# Patient Record
Sex: Female | Born: 1950 | Race: White | Hispanic: No | Marital: Married | Smoking: Never smoker
Health system: Southern US, Community
[De-identification: ages and names within clinical notes are randomized; demographics above are authoritative.]

## PROBLEM LIST (undated history)

## (undated) DIAGNOSIS — M545 Low back pain: Secondary | ICD-10-CM

## (undated) DIAGNOSIS — R002 Palpitations: Secondary | ICD-10-CM

## (undated) DIAGNOSIS — R5381 Other malaise: Secondary | ICD-10-CM

## (undated) DIAGNOSIS — E785 Hyperlipidemia, unspecified: Secondary | ICD-10-CM

## (undated) DIAGNOSIS — K219 Gastro-esophageal reflux disease without esophagitis: Secondary | ICD-10-CM

## (undated) DIAGNOSIS — M8580 Other specified disorders of bone density and structure, unspecified site: Secondary | ICD-10-CM

## (undated) DIAGNOSIS — E0590 Thyrotoxicosis, unspecified without thyrotoxic crisis or storm: Secondary | ICD-10-CM

## (undated) DIAGNOSIS — J4520 Mild intermittent asthma, uncomplicated: Secondary | ICD-10-CM

## (undated) DIAGNOSIS — R739 Hyperglycemia, unspecified: Secondary | ICD-10-CM

## (undated) DIAGNOSIS — T7840XA Allergy, unspecified, initial encounter: Secondary | ICD-10-CM

## (undated) DIAGNOSIS — R5383 Other fatigue: Secondary | ICD-10-CM

## (undated) DIAGNOSIS — E559 Vitamin D deficiency, unspecified: Secondary | ICD-10-CM

## (undated) DIAGNOSIS — F419 Anxiety disorder, unspecified: Secondary | ICD-10-CM

## (undated) HISTORY — DX: Mild intermittent asthma, uncomplicated: J45.20

## (undated) HISTORY — DX: Hyperlipidemia, unspecified: E78.5

## (undated) HISTORY — PX: TUBAL LIGATION: SHX77

## (undated) HISTORY — DX: Palpitations: R00.2

## (undated) HISTORY — DX: Thyrotoxicosis, unspecified without thyrotoxic crisis or storm: E05.90

## (undated) HISTORY — DX: Vitamin D deficiency, unspecified: E55.9

## (undated) HISTORY — DX: Other fatigue: R53.83

## (undated) HISTORY — PX: COLONOSCOPY: SHX174

## (undated) HISTORY — DX: Low back pain: M54.5

## (undated) HISTORY — DX: Gastro-esophageal reflux disease without esophagitis: K21.9

## (undated) HISTORY — DX: Hyperglycemia, unspecified: R73.9

## (undated) HISTORY — DX: Allergy, unspecified, initial encounter: T78.40XA

## (undated) HISTORY — PX: TOOTH EXTRACTION: SUR596

## (undated) HISTORY — DX: Anxiety disorder, unspecified: F41.9

## (undated) HISTORY — DX: Other malaise: R53.81

## (undated) HISTORY — DX: Other specified disorders of bone density and structure, unspecified site: M85.80

---

## 1998-10-23 HISTORY — PX: BACK SURGERY: SHX140

## 1998-11-02 ENCOUNTER — Ambulatory Visit (HOSPITAL_COMMUNITY): Admission: RE | Admit: 1998-11-02 | Discharge: 1998-11-02 | Payer: Self-pay | Admitting: Neurosurgery

## 1998-11-02 ENCOUNTER — Encounter: Payer: Self-pay | Admitting: Neurosurgery

## 2002-08-02 ENCOUNTER — Other Ambulatory Visit: Admission: RE | Admit: 2002-08-02 | Discharge: 2002-08-02 | Payer: Self-pay | Admitting: Obstetrics and Gynecology

## 2002-09-22 HISTORY — PX: ENDOMETRIAL ABLATION: SHX621

## 2003-08-04 ENCOUNTER — Other Ambulatory Visit: Admission: RE | Admit: 2003-08-04 | Discharge: 2003-08-04 | Payer: Self-pay | Admitting: Obstetrics and Gynecology

## 2004-08-13 ENCOUNTER — Other Ambulatory Visit: Admission: RE | Admit: 2004-08-13 | Discharge: 2004-08-13 | Payer: Self-pay | Admitting: Obstetrics and Gynecology

## 2005-08-25 ENCOUNTER — Other Ambulatory Visit: Admission: RE | Admit: 2005-08-25 | Discharge: 2005-08-25 | Payer: Self-pay | Admitting: Obstetrics and Gynecology

## 2011-06-06 ENCOUNTER — Encounter: Payer: Self-pay | Admitting: Gastroenterology

## 2011-06-17 ENCOUNTER — Encounter: Payer: Self-pay | Admitting: Gastroenterology

## 2011-06-17 ENCOUNTER — Ambulatory Visit (AMBULATORY_SURGERY_CENTER): Payer: Managed Care, Other (non HMO) | Admitting: *Deleted

## 2011-06-17 VITALS — Ht 66.0 in | Wt 177.0 lb

## 2011-06-17 DIAGNOSIS — Z1211 Encounter for screening for malignant neoplasm of colon: Secondary | ICD-10-CM

## 2011-06-17 MED ORDER — PEG-KCL-NACL-NASULF-NA ASC-C 100 G PO SOLR: ORAL | Status: DC

## 2011-07-01 ENCOUNTER — Other Ambulatory Visit: Payer: Self-pay | Admitting: Gastroenterology

## 2012-09-22 HISTORY — PX: CHOLECYSTECTOMY: SHX55

## 2015-10-01 DIAGNOSIS — K219 Gastro-esophageal reflux disease without esophagitis: Secondary | ICD-10-CM | POA: Diagnosis not present

## 2015-10-01 DIAGNOSIS — R1032 Left lower quadrant pain: Secondary | ICD-10-CM | POA: Diagnosis not present

## 2015-10-01 DIAGNOSIS — K644 Residual hemorrhoidal skin tags: Secondary | ICD-10-CM | POA: Diagnosis not present

## 2015-10-01 DIAGNOSIS — R1012 Left upper quadrant pain: Secondary | ICD-10-CM | POA: Diagnosis not present

## 2015-10-12 DIAGNOSIS — R109 Unspecified abdominal pain: Secondary | ICD-10-CM | POA: Diagnosis not present

## 2015-10-17 DIAGNOSIS — M5136 Other intervertebral disc degeneration, lumbar region: Secondary | ICD-10-CM | POA: Diagnosis not present

## 2015-10-17 DIAGNOSIS — R109 Unspecified abdominal pain: Secondary | ICD-10-CM | POA: Diagnosis not present

## 2015-10-17 DIAGNOSIS — I700 Atherosclerosis of aorta: Secondary | ICD-10-CM | POA: Diagnosis not present

## 2016-03-04 DIAGNOSIS — Z1389 Encounter for screening for other disorder: Secondary | ICD-10-CM | POA: Diagnosis not present

## 2016-03-04 DIAGNOSIS — E663 Overweight: Secondary | ICD-10-CM | POA: Diagnosis not present

## 2016-03-04 DIAGNOSIS — R109 Unspecified abdominal pain: Secondary | ICD-10-CM | POA: Diagnosis not present

## 2016-03-04 DIAGNOSIS — J4520 Mild intermittent asthma, uncomplicated: Secondary | ICD-10-CM | POA: Diagnosis not present

## 2016-03-04 DIAGNOSIS — E785 Hyperlipidemia, unspecified: Secondary | ICD-10-CM | POA: Diagnosis not present

## 2016-03-04 DIAGNOSIS — Z6829 Body mass index (BMI) 29.0-29.9, adult: Secondary | ICD-10-CM | POA: Diagnosis not present

## 2016-03-04 DIAGNOSIS — K219 Gastro-esophageal reflux disease without esophagitis: Secondary | ICD-10-CM | POA: Diagnosis not present

## 2016-03-04 DIAGNOSIS — R739 Hyperglycemia, unspecified: Secondary | ICD-10-CM | POA: Diagnosis not present

## 2016-06-26 DIAGNOSIS — Z23 Encounter for immunization: Secondary | ICD-10-CM | POA: Diagnosis not present

## 2016-07-10 DIAGNOSIS — Z01419 Encounter for gynecological examination (general) (routine) without abnormal findings: Secondary | ICD-10-CM | POA: Diagnosis not present

## 2016-07-10 DIAGNOSIS — Z6828 Body mass index (BMI) 28.0-28.9, adult: Secondary | ICD-10-CM | POA: Diagnosis not present

## 2016-07-10 DIAGNOSIS — Z124 Encounter for screening for malignant neoplasm of cervix: Secondary | ICD-10-CM | POA: Diagnosis not present

## 2016-07-10 DIAGNOSIS — Z1231 Encounter for screening mammogram for malignant neoplasm of breast: Secondary | ICD-10-CM | POA: Diagnosis not present

## 2016-07-10 DIAGNOSIS — M8588 Other specified disorders of bone density and structure, other site: Secondary | ICD-10-CM | POA: Diagnosis not present

## 2016-09-09 DIAGNOSIS — R739 Hyperglycemia, unspecified: Secondary | ICD-10-CM | POA: Diagnosis not present

## 2016-09-09 DIAGNOSIS — E785 Hyperlipidemia, unspecified: Secondary | ICD-10-CM | POA: Diagnosis not present

## 2016-09-09 DIAGNOSIS — J4520 Mild intermittent asthma, uncomplicated: Secondary | ICD-10-CM | POA: Diagnosis not present

## 2016-09-09 DIAGNOSIS — K219 Gastro-esophageal reflux disease without esophagitis: Secondary | ICD-10-CM | POA: Diagnosis not present

## 2016-09-09 DIAGNOSIS — E559 Vitamin D deficiency, unspecified: Secondary | ICD-10-CM | POA: Diagnosis not present

## 2016-09-09 DIAGNOSIS — Z79899 Other long term (current) drug therapy: Secondary | ICD-10-CM | POA: Diagnosis not present

## 2016-12-23 DIAGNOSIS — Z9181 History of falling: Secondary | ICD-10-CM | POA: Diagnosis not present

## 2016-12-23 DIAGNOSIS — J4520 Mild intermittent asthma, uncomplicated: Secondary | ICD-10-CM | POA: Diagnosis not present

## 2016-12-23 DIAGNOSIS — K219 Gastro-esophageal reflux disease without esophagitis: Secondary | ICD-10-CM | POA: Diagnosis not present

## 2017-01-21 DIAGNOSIS — R1032 Left lower quadrant pain: Secondary | ICD-10-CM | POA: Diagnosis not present

## 2017-01-21 DIAGNOSIS — K219 Gastro-esophageal reflux disease without esophagitis: Secondary | ICD-10-CM | POA: Diagnosis not present

## 2017-01-21 DIAGNOSIS — R1012 Left upper quadrant pain: Secondary | ICD-10-CM | POA: Diagnosis not present

## 2017-01-21 DIAGNOSIS — K644 Residual hemorrhoidal skin tags: Secondary | ICD-10-CM | POA: Diagnosis not present

## 2017-01-22 DIAGNOSIS — Z1211 Encounter for screening for malignant neoplasm of colon: Secondary | ICD-10-CM | POA: Diagnosis not present

## 2017-01-22 DIAGNOSIS — Z1389 Encounter for screening for other disorder: Secondary | ICD-10-CM | POA: Diagnosis not present

## 2017-01-22 DIAGNOSIS — H5203 Hypermetropia, bilateral: Secondary | ICD-10-CM | POA: Diagnosis not present

## 2017-01-22 DIAGNOSIS — Z6830 Body mass index (BMI) 30.0-30.9, adult: Secondary | ICD-10-CM | POA: Diagnosis not present

## 2017-01-22 DIAGNOSIS — Z23 Encounter for immunization: Secondary | ICD-10-CM | POA: Diagnosis not present

## 2017-01-22 DIAGNOSIS — Z0000 Encounter for general adult medical examination without abnormal findings: Secondary | ICD-10-CM | POA: Diagnosis not present

## 2017-01-22 DIAGNOSIS — Z136 Encounter for screening for cardiovascular disorders: Secondary | ICD-10-CM | POA: Diagnosis not present

## 2017-01-22 DIAGNOSIS — H4303 Vitreous prolapse, bilateral: Secondary | ICD-10-CM | POA: Diagnosis not present

## 2017-01-22 DIAGNOSIS — E785 Hyperlipidemia, unspecified: Secondary | ICD-10-CM | POA: Diagnosis not present

## 2017-01-22 DIAGNOSIS — H43393 Other vitreous opacities, bilateral: Secondary | ICD-10-CM | POA: Diagnosis not present

## 2017-01-22 DIAGNOSIS — H52223 Regular astigmatism, bilateral: Secondary | ICD-10-CM | POA: Diagnosis not present

## 2017-01-22 DIAGNOSIS — H43813 Vitreous degeneration, bilateral: Secondary | ICD-10-CM | POA: Diagnosis not present

## 2017-01-22 DIAGNOSIS — H25813 Combined forms of age-related cataract, bilateral: Secondary | ICD-10-CM | POA: Diagnosis not present

## 2017-01-27 DIAGNOSIS — Z86010 Personal history of colonic polyps: Secondary | ICD-10-CM | POA: Diagnosis not present

## 2017-01-27 DIAGNOSIS — Z79899 Other long term (current) drug therapy: Secondary | ICD-10-CM | POA: Diagnosis not present

## 2017-01-27 DIAGNOSIS — Z1211 Encounter for screening for malignant neoplasm of colon: Secondary | ICD-10-CM | POA: Diagnosis not present

## 2017-01-27 DIAGNOSIS — K644 Residual hemorrhoidal skin tags: Secondary | ICD-10-CM | POA: Diagnosis not present

## 2017-01-27 DIAGNOSIS — J45909 Unspecified asthma, uncomplicated: Secondary | ICD-10-CM | POA: Diagnosis not present

## 2017-01-27 DIAGNOSIS — Z800 Family history of malignant neoplasm of digestive organs: Secondary | ICD-10-CM | POA: Diagnosis not present

## 2017-01-27 DIAGNOSIS — Z7982 Long term (current) use of aspirin: Secondary | ICD-10-CM | POA: Diagnosis not present

## 2017-01-27 DIAGNOSIS — Z9049 Acquired absence of other specified parts of digestive tract: Secondary | ICD-10-CM | POA: Diagnosis not present

## 2017-02-16 DIAGNOSIS — N3091 Cystitis, unspecified with hematuria: Secondary | ICD-10-CM | POA: Diagnosis not present

## 2017-02-16 DIAGNOSIS — R300 Dysuria: Secondary | ICD-10-CM | POA: Diagnosis not present

## 2017-04-06 DIAGNOSIS — H109 Unspecified conjunctivitis: Secondary | ICD-10-CM | POA: Diagnosis not present

## 2017-04-09 DIAGNOSIS — H5713 Ocular pain, bilateral: Secondary | ICD-10-CM | POA: Diagnosis not present

## 2017-04-09 DIAGNOSIS — H04123 Dry eye syndrome of bilateral lacrimal glands: Secondary | ICD-10-CM | POA: Diagnosis not present

## 2017-04-16 DIAGNOSIS — H04123 Dry eye syndrome of bilateral lacrimal glands: Secondary | ICD-10-CM | POA: Diagnosis not present

## 2017-07-28 DIAGNOSIS — Z23 Encounter for immunization: Secondary | ICD-10-CM | POA: Diagnosis not present

## 2017-08-28 DIAGNOSIS — E785 Hyperlipidemia, unspecified: Secondary | ICD-10-CM | POA: Diagnosis not present

## 2017-08-28 DIAGNOSIS — K219 Gastro-esophageal reflux disease without esophagitis: Secondary | ICD-10-CM | POA: Diagnosis not present

## 2017-08-28 DIAGNOSIS — E0590 Thyrotoxicosis, unspecified without thyrotoxic crisis or storm: Secondary | ICD-10-CM | POA: Diagnosis not present

## 2017-08-28 DIAGNOSIS — J4520 Mild intermittent asthma, uncomplicated: Secondary | ICD-10-CM | POA: Diagnosis not present

## 2017-08-28 DIAGNOSIS — R739 Hyperglycemia, unspecified: Secondary | ICD-10-CM | POA: Diagnosis not present

## 2017-08-28 DIAGNOSIS — Z6828 Body mass index (BMI) 28.0-28.9, adult: Secondary | ICD-10-CM | POA: Diagnosis not present

## 2017-10-13 DIAGNOSIS — M545 Low back pain, unspecified: Secondary | ICD-10-CM

## 2017-10-13 DIAGNOSIS — J4520 Mild intermittent asthma, uncomplicated: Secondary | ICD-10-CM

## 2017-10-13 DIAGNOSIS — M8580 Other specified disorders of bone density and structure, unspecified site: Secondary | ICD-10-CM

## 2017-10-13 DIAGNOSIS — E785 Hyperlipidemia, unspecified: Secondary | ICD-10-CM

## 2017-10-13 DIAGNOSIS — R5381 Other malaise: Secondary | ICD-10-CM

## 2017-10-13 DIAGNOSIS — R5383 Other fatigue: Secondary | ICD-10-CM

## 2017-10-13 DIAGNOSIS — E559 Vitamin D deficiency, unspecified: Secondary | ICD-10-CM

## 2017-10-13 DIAGNOSIS — R739 Hyperglycemia, unspecified: Secondary | ICD-10-CM

## 2017-10-13 DIAGNOSIS — F419 Anxiety disorder, unspecified: Secondary | ICD-10-CM

## 2017-10-13 DIAGNOSIS — E0590 Thyrotoxicosis, unspecified without thyrotoxic crisis or storm: Secondary | ICD-10-CM

## 2017-10-13 HISTORY — DX: Low back pain, unspecified: M54.50

## 2017-10-13 HISTORY — DX: Hyperglycemia, unspecified: R73.9

## 2017-10-13 HISTORY — DX: Anxiety disorder, unspecified: F41.9

## 2017-10-13 HISTORY — DX: Thyrotoxicosis, unspecified without thyrotoxic crisis or storm: E05.90

## 2017-10-13 HISTORY — DX: Other specified disorders of bone density and structure, unspecified site: M85.80

## 2017-10-13 HISTORY — DX: Other malaise: R53.81

## 2017-10-13 HISTORY — DX: Mild intermittent asthma, uncomplicated: J45.20

## 2017-10-13 HISTORY — DX: Hyperlipidemia, unspecified: E78.5

## 2017-10-13 HISTORY — DX: Vitamin D deficiency, unspecified: E55.9

## 2017-10-14 ENCOUNTER — Ambulatory Visit (INDEPENDENT_AMBULATORY_CARE_PROVIDER_SITE_OTHER): Payer: Medicare Other | Admitting: Cardiology

## 2017-10-14 ENCOUNTER — Encounter: Payer: Self-pay | Admitting: Cardiology

## 2017-10-14 VITALS — BP 140/80 | HR 72 | Ht 66.0 in | Wt 178.0 lb

## 2017-10-14 DIAGNOSIS — R0602 Shortness of breath: Secondary | ICD-10-CM | POA: Insufficient documentation

## 2017-10-14 DIAGNOSIS — R079 Chest pain, unspecified: Secondary | ICD-10-CM | POA: Diagnosis not present

## 2017-10-14 HISTORY — DX: Shortness of breath: R06.02

## 2017-10-14 HISTORY — DX: Chest pain, unspecified: R07.9

## 2017-10-14 MED ORDER — METOPROLOL TARTRATE 50 MG PO TABS: 50.0000 mg | ORAL_TABLET | Freq: Once | ORAL | 0 refills | Status: DC

## 2017-10-14 NOTE — Progress Notes (Signed)
Cardiology Office Note:    Date:  10/14/2017   ID:  Sheri Love, Sheri Love Aug 15, 1951, MRN [id number]  PCP:  Nicoletta Dress, MD  Cardiologist:  Shirlee More, MD    Referring MD: Nicoletta Dress, MD    ASSESSMENT:    1. Chest pain in adult   2. SOB (shortness of breath) on exertion    PLAN:    In order of problems listed above:  1. Atypical chest pain multiple previous ischemic evaluations and after detailed discussion of benefits options and risk elects to undergo cardiac CTA to define if she has coronary artery disease and to look for evidence of flow-limiting stenosis by fractional flow reserve.  If abnormal and high risk markers we need to consider the merits of angiography and cardiac revascularization 2. See discussion above she will undergo an ischemia evaluation regarding anginal equivalent   Next appointment: 4 weeks after her cardiac CTA   Medication Adjustments/Labs and Tests Ordered: Current medicines are reviewed at length with the patient today.  Concerns regarding medicines are outlined above.  Orders Placed This Encounter  Procedures  . CT CORONARY MORPH W/CTA COR W/SCORE W/CA W/CM &/OR WO/CM  . CT CORONARY FRACTIONAL FLOW RESERVE DATA PREP  . CT CORONARY FRACTIONAL FLOW RESERVE FLUID ANALYSIS  . Basic metabolic panel  . EKG 12-Lead   Meds ordered this encounter  Medications  . metoprolol tartrate (LOPRESSOR) 50 MG tablet    Sig: Take 1 tablet (50 mg total) by mouth once for 1 dose. Take 1 tablet 1 hour prior to cardiac CTA.    Dispense:  1 tablet    Refill:  0    Chief Complaint  Patient presents with  . Shortness of Breath    History of Present Illness:    Sheri Love is a 67 y.o. female with a hx of Sob and asthma last seen more than 2 1/2 yrs ago. I do not have access to my previous records in [employer]. Compliance with diet, lifestyle and medications: Yes She has had normal stress tests in 2002, 2005, 2009 and most recently a stress echo   At 10 mets in 2014 EF 60%. She is relocated to [place] but is living part of the year in [place].  Her predominant complaint is that she has intermittent shortness of breath which seems to be unrelated to her asthma but also has episodes of nonexertional chest tightness.  She has some palpitation not severe or not sustained.  She is concerned whether her symptoms are a reflection of heart disease the chest pain is nonexertional pressure substernal at times radiates into the neck.  The episodes are typically brief. Past Medical History:  Diagnosis Date  . Allergy    cold climate and seasonal  . Anxiety 10/13/2017  . Asthma    cold climate  . Dyslipidemia 10/13/2017  . GERD (gastroesophageal reflux disease)   . Hyperglycemia 10/13/2017  . Hyperthyroidism 10/13/2017  . Lumbago 10/13/2017  . Malaise and fatigue 10/13/2017  . Mild intermittent asthma without complication 12/29/8117  . Osteopenia 10/13/2017  . Vitamin D deficiency 10/13/2017   `   Past Surgical History:  Procedure Laterality Date  . BACK SURGERY  10/1998   lower back  . CHOLECYSTECTOMY  2014  . COLONOSCOPY    . ENDOMETRIAL ABLATION  2004  . TOOTH EXTRACTION    . TUBAL LIGATION  1990's    Current Medications: Current Meds  Medication Sig  . aspirin 81 MG  tablet Take 81 mg by mouth daily.    . calcium carbonate (OS-CAL) 600 MG TABS Take 600 mg by mouth daily.    . cyclobenzaprine (FLEXERIL) 10 MG tablet Take 1 tablet by mouth as needed.  . Multiple Vitamins-Minerals (MULTIVITAMIN WITH MINERALS) tablet Take 1 tablet by mouth daily.    . Nutritional Supplements (VITAMIN D BOOSTER PO) Take by mouth daily.  . pantoprazole (PROTONIX) 20 MG tablet daily.  . peg 3350 powder (MOVIPREP) 100 G SOLR MOVI PREP take as directed  . PROAIR HFA 108 (90 BASE) MCG/ACT inhaler Take 1 puff by mouth Ad lib.     Allergies:   Erythromycin   Social History   Socioeconomic History  . Marital status: Married    Spouse name: None  .  Number of children: None  . Years of education: None  . Highest education level: None  Social Needs  . Financial resource strain: None  . Food insecurity - worry: None  . Food insecurity - inability: None  . Transportation needs - medical: None  . Transportation needs - non-medical: None  Occupational History  . None  Tobacco Use  . Smoking status: Never Smoker  . Smokeless tobacco: Never Used  Substance and Sexual Activity  . Alcohol use: Yes    Comment: rarely wine  . Drug use: No  . Sexual activity: None  Other Topics Concern  . None  Social History Narrative  . None     Family History: The patient's family history includes Atrial fibrillation in her brother; Colon cancer in her mother; Diabetes in her maternal grandmother; Heart attack in her father; Heart disease in her brother, father, and mother; Hyperlipidemia in her father and mother; Hypertension in her father and mother; Liver cancer in her mother; Pancreatic cancer in her mother. ROS:   Please see the history of present illness.    All other systems reviewed and are negative.  EKGs/Labs/Other Studies Reviewed:    The following studies were reviewed today:  EKG:  EKG ordered today.  The ekg ordered today demonstrates sinus rhythm and normal  Recent Labs: CMP and A1c normal No results found for requested labs within last 8760 hours.  Recent Lipid Panel Chol 206 HDL 61 LDL 124 TG 106 No results found for: CHOL, TRIG, HDL, CHOLHDL, VLDL, LDLCALC, LDLDIRECT  Physical Exam:    VS:  BP 140/80 (BP Location: Left Arm, Patient Position: Sitting, Cuff Size: Normal)   Pulse 72   Ht 5\' 6"  (1.676 m)   Wt 178 lb (80.7 kg)   SpO2 98%   BMI 28.73 kg/m     Wt Readings from Last 3 Encounters:  10/14/17 178 lb (80.7 kg)  06/17/11 177 lb (80.3 kg)     GEN:  Well nourished, well developed in no acute distress HEENT: Normal NECK: No JVD; No carotid bruits LYMPHATICS: No lymphadenopathy CARDIAC: RRR, no murmurs, rubs,  gallops RESPIRATORY:  Clear to auscultation without rales, wheezing or rhonchi  ABDOMEN: Soft, non-tender, non-distended MUSCULOSKELETAL:  No edema; No deformity  SKIN: Warm and dry NEUROLOGIC:  Alert and oriented x 3 PSYCHIATRIC:  Normal affect    Signed, Shirlee More, MD  10/14/2017 4:04 PM    [facility]

## 2017-10-14 NOTE — Patient Instructions (Addendum)
Medication Instructions:  Your physician recommends that you continue on your current medications as directed. Please refer to the Current Medication list given to you today.  Labwork: Your physician recommends that you return for lab work in: today. BMP  Testing/Procedures: Your physician has requested that you have cardiac CT. Cardiac computed tomography (CT) is a painless test that uses an x-ray machine to take clear, detailed pictures of your heart. For further information please visit HugeFiesta.tn. Please follow instruction sheet as given.   Please arrive at the [facility] main entrance of [facility] at xx:xx AM (30-45 minutes prior to test start time)  [facility] [address] [place], [place] [address] [phone]  Proceed to the [facility] Radiology Department (First Floor).  Please follow these instructions carefully (unless otherwise directed):  Hold all erectile dysfunction medications at least 48 hours prior to test.  On the Night Before the Test: . Drink plenty of water. . Do not consume any caffeinated/decaffeinated beverages or chocolate 12 hours prior to your test. . Do not take any antihistamines 12 hours prior to your test.  On the Day of the Test: . Drink plenty of water. Do not drink any water within one hour of the test. . Do not eat any food 4 hours prior to the test. . You may take your regular medications prior to the test. . IF NOT ON A BETA BLOCKER - Take 50 mg of lopressor (metoprolol) one hour before the test.  After the Test: . Drink plenty of water. . After receiving IV contrast, you may experience a mild flushed feeling. This is normal. . On occasion, you may experience a mild rash up to 24 hours after the test. This is not dangerous. If this occurs, you can take Benadryl 25 mg and increase your fluid intake. . If you experience trouble breathing, this can be serious. If it is severe call 911 IMMEDIATELY. If it is  mild, please call our office. . If you take any of these medications: Glipizide/Metformin, Avandament, Glucavance, please do not take 48 hours after completing test.   Follow-Up: Your physician recommends that you schedule a follow-up appointment in: 4 weeks.  Any Other Special Instructions Will Be Listed Below (If Applicable).     If you need a refill on your cardiac medications before your next appointment, please call your pharmacy.    KardiaMobile [email or web address]        FDA-cleared, clinical grade mobile EKG monitor: Jodelle Red is the most clinically-validated mobile EKG used by the world's leading cardiac care medical professionals With Basic service, know instantly if your heart rhythm is normal or if atrial fibrillation is detected, and email the last single EKG recording to yourself or your doctor Premium service, available for purchase through the Kardia app for $9.99 per month or $99 per year, includes unlimited history and storage of your EKG recordings, a monthly EKG summary report to share with your doctor, along with the ability to track your blood pressure, activity and weight Includes one KardiaMobile phone clip FREE SHIPPING: Standard delivery 1-3 business days. Orders placed by 11:00am PST will ship that afternoon. Otherwise, will ship next business day. All orders ship via [employer] from [place], [place]

## 2017-10-15 LAB — BASIC METABOLIC PANEL
BUN / CREAT RATIO: 22 (ref 12–28)
BUN: 19 mg/dL (ref 8–27)
CALCIUM: 10.2 mg/dL (ref 8.7–10.3)
CHLORIDE: 103 mmol/L (ref 96–106)
CO2: 25 mmol/L (ref 20–29)
Creatinine, Ser: 0.86 mg/dL (ref 0.57–1.00)
GFR calc Af Amer: 81 mL/min/{1.73_m2} (ref >59)
GFR calc non Af Amer: 70 mL/min/{1.73_m2} (ref >59)
GLUCOSE: 94 mg/dL (ref 65–99)
POTASSIUM: 5 mmol/L (ref 3.5–5.2)
Sodium: 142 mmol/L (ref 134–144)

## 2017-10-15 NOTE — Addendum Note (Signed)
Addended by: Warner Mccreedy E on: 10/15/2017 11:12 AM   Modules accepted: Orders

## 2017-10-26 ENCOUNTER — Ambulatory Visit (HOSPITAL_COMMUNITY)
Admission: RE | Admit: 2017-10-26 | Discharge: 2017-10-26 | Disposition: A | Discharge status: Home / Self Care | Payer: Medicare Other | Source: Ambulatory Visit | Attending: Cardiology | Admitting: Cardiology

## 2017-10-26 ENCOUNTER — Ambulatory Visit (HOSPITAL_COMMUNITY): Admission: RE | Admit: 2017-10-26 | Payer: Medicare Other | Source: Ambulatory Visit

## 2017-10-26 DIAGNOSIS — R0602 Shortness of breath: Secondary | ICD-10-CM | POA: Insufficient documentation

## 2017-10-26 DIAGNOSIS — K449 Diaphragmatic hernia without obstruction or gangrene: Secondary | ICD-10-CM | POA: Diagnosis not present

## 2017-10-26 DIAGNOSIS — R0789 Other chest pain: Secondary | ICD-10-CM | POA: Diagnosis not present

## 2017-10-26 DIAGNOSIS — R079 Chest pain, unspecified: Secondary | ICD-10-CM

## 2017-10-26 MED ORDER — NITROGLYCERIN 0.4 MG SL SUBL
0.8000 mg | SUBLINGUAL_TABLET | Freq: Once | SUBLINGUAL | Status: AC
  Administered 2017-10-26: 0.8 mg via SUBLINGUAL

## 2017-10-26 MED ORDER — METOPROLOL TARTRATE 5 MG/5ML IV SOLN
INTRAVENOUS | Status: AC
  Filled 2017-10-26: qty 5

## 2017-10-26 MED ORDER — NITROGLYCERIN 0.4 MG SL SUBL
SUBLINGUAL_TABLET | SUBLINGUAL | Status: AC
  Filled 2017-10-26: qty 2

## 2017-10-26 MED ORDER — IOPAMIDOL (ISOVUE-370) INJECTION 76%
INTRAVENOUS | Status: AC
  Administered 2017-10-26: 100 mL
  Filled 2017-10-26: qty 100

## 2017-10-26 MED ORDER — METOPROLOL TARTRATE 5 MG/5ML IV SOLN
5.0000 mg | INTRAVENOUS | Status: DC | PRN
  Administered 2017-10-26: 5 mg via INTRAVENOUS

## 2017-10-28 DIAGNOSIS — Z1231 Encounter for screening mammogram for malignant neoplasm of breast: Secondary | ICD-10-CM | POA: Diagnosis not present

## 2017-11-01 NOTE — Progress Notes (Signed)
Cardiology Office Note:    Date:  11/02/2017   ID:  Sheri Love, DOB [birth year], MRN [id number]  PCP:  Nicoletta Dress, MD  Cardiologist:  Shirlee More, MD    Referring MD: Nicoletta Dress, MD    ASSESSMENT:    1. Chest pain in adult   2. SOB (shortness of breath) on exertion    PLAN:    In order of problems listed above:  1. Improved she is quite reassured with the results of her cardiac CTA at this time I perform no further cardiac testing 2. Clinically noncardiac likely related to asthma I encouraged compliance with her bronchodilator   Next appointment: As needed   Medication Adjustments/Labs and Tests Ordered: Current medicines are reviewed at length with the patient today.  Concerns regarding medicines are outlined above.  No orders of the defined types were placed in this encounter.  No orders of the defined types were placed in this encounter.   Chief Complaint  Patient presents with  . Follow-up    after cardiac CTA    History of Present Illness:    Sheri Love is a 67 y.o. female with a hx of exertional SOB last seen 10/14/17 and advised a cardiac CTA.Marland Kitchen Compliance with diet, lifestyle and medications: Yes She is quite reassured by the findings of her cardiac CTA and I reinforced with her that I would not place her on lipid-lowering therapy with close to a 0 calcium score.  She relates she uses a bronchodilator intermittently and has variable exertional shortness of breath.  She has had no further chest pain. Past Medical History:  Diagnosis Date  . Allergy    cold climate and seasonal  . Anxiety 10/13/2017  . Asthma    cold climate  . Dyslipidemia 10/13/2017  . GERD (gastroesophageal reflux disease)   . Hyperglycemia 10/13/2017  . Hyperthyroidism 10/13/2017  . Lumbago 10/13/2017  . Malaise and fatigue 10/13/2017  . Mild intermittent asthma without complication 5/46/5681  . Osteopenia 10/13/2017  . Vitamin D deficiency 10/13/2017    Past  Surgical History:  Procedure Laterality Date  . BACK SURGERY  10/1998   lower back  . CHOLECYSTECTOMY  2014  . COLONOSCOPY    . ENDOMETRIAL ABLATION  2004  . TOOTH EXTRACTION    . TUBAL LIGATION  1990's    Current Medications: Current Meds  Medication Sig  . aspirin 81 MG tablet Take 81 mg by mouth daily.    . calcium carbonate (OS-CAL) 600 MG TABS Take 600 mg by mouth daily.    . cyclobenzaprine (FLEXERIL) 10 MG tablet Take 1 tablet by mouth as needed.  . Multiple Vitamins-Minerals (MULTIVITAMIN WITH MINERALS) tablet Take 1 tablet by mouth daily.    . Nutritional Supplements (VITAMIN D BOOSTER PO) Take by mouth daily.  . pantoprazole (PROTONIX) 20 MG tablet Take 20 mg by mouth daily.   Marland Kitchen PROAIR HFA 108 (90 BASE) MCG/ACT inhaler Take 1 puff by mouth Ad lib.     Allergies:   Erythromycin   Social History   Socioeconomic History  . Marital status: Married    Spouse name: None  . Number of children: None  . Years of education: None  . Highest education level: None  Social Needs  . Financial resource strain: None  . Food insecurity - worry: None  . Food insecurity - inability: None  . Transportation needs - medical: None  . Transportation needs - non-medical: None  Occupational History  .  None  Tobacco Use  . Smoking status: Never Smoker  . Smokeless tobacco: Never Used  Substance and Sexual Activity  . Alcohol use: Yes    Comment: rarely wine  . Drug use: No  . Sexual activity: None  Other Topics Concern  . None  Social History Narrative  . None     Family History: The patient's family history includes Atrial fibrillation in her brother; Colon cancer in her mother; Diabetes in her maternal grandmother; Heart attack in her father; Heart disease in her brother, father, and mother; Hyperlipidemia in her father and mother; Hypertension in her father and mother; Liver cancer in her mother; Pancreatic cancer in her mother. ROS:   Please see the history of present  illness.    All other systems reviewed and are negative.  EKGs/Labs/Other Studies Reviewed:    The following studies were reviewed today:  Cardiac CTA: IMPRESSION: 1. Coronary artery calcium score of 2 Agatston units, placing the patient in the 49th percentile for age and gender, suggesting intermediate risk for future cardiac events. 2.  No obstructive coronary disease noted. LM: No plaque or stenosis. LAD system: The distal LAD is small in caliber. No significant plaque or stenosis noted. Circumflex system: Large PLOM.  No plaque or stenosis in LCx system. RCA system: No significant plaque or stenosis.  Recent Labs: 10/14/2017: BUN 19; Creatinine, Ser 0.86; Potassium 5.0; Sodium 142  Recent Lipid Panel No results found for: CHOL, TRIG, HDL, CHOLHDL, VLDL, LDLCALC, LDLDIRECT  Physical Exam:    VS:  BP 124/78 (BP Location: Right Arm, Patient Position: Sitting, Cuff Size: Normal)   Pulse 66   Ht 5\' 6"  (1.676 m)   Wt 183 lb 6.4 oz (83.2 kg)   SpO2 98%   BMI 29.60 kg/m     Wt Readings from Last 3 Encounters:  11/02/17 183 lb 6.4 oz (83.2 kg)  10/14/17 178 lb (80.7 kg)  06/17/11 177 lb (80.3 kg)     GEN:  Well nourished, well developed in no acute distress HEENT: Normal NECK: No JVD; No carotid bruits LYMPHATICS: No lymphadenopathy CARDIAC: RRR, no murmurs, rubs, gallops RESPIRATORY:  Clear to auscultation without rales, wheezing or rhonchi  ABDOMEN: Soft, non-tender, non-distended MUSCULOSKELETAL:  No edema; No deformity  SKIN: Warm and dry NEUROLOGIC:  Alert and oriented x 3 PSYCHIATRIC:  Normal affect    Signed, Shirlee More, MD  11/02/2017 9:33 AM    [place]

## 2017-11-02 ENCOUNTER — Ambulatory Visit (INDEPENDENT_AMBULATORY_CARE_PROVIDER_SITE_OTHER): Payer: Medicare Other | Admitting: Cardiology

## 2017-11-02 ENCOUNTER — Encounter: Payer: Self-pay | Admitting: Cardiology

## 2017-11-02 VITALS — BP 124/78 | HR 66 | Ht 66.0 in | Wt 183.4 lb

## 2017-11-02 DIAGNOSIS — R079 Chest pain, unspecified: Secondary | ICD-10-CM

## 2017-11-02 DIAGNOSIS — R0602 Shortness of breath: Secondary | ICD-10-CM | POA: Diagnosis not present

## 2017-11-02 NOTE — Patient Instructions (Signed)
Medication Instructions:  Your physician recommends that you continue on your current medications as directed. Please refer to the Current Medication list given to you today.   Labwork: None   Testing/Procedures: None  Follow-Up: Your physician wants you to follow-up as needed.   Any Other Special Instructions Will Be Listed Below (If Applicable).     If you need a refill on your cardiac medications before your next appointment, please call your pharmacy.

## 2017-11-16 ENCOUNTER — Ambulatory Visit: Payer: Medicare Other | Admitting: Cardiology

## 2017-11-27 DIAGNOSIS — R0600 Dyspnea, unspecified: Secondary | ICD-10-CM | POA: Diagnosis not present

## 2017-11-27 DIAGNOSIS — J4520 Mild intermittent asthma, uncomplicated: Secondary | ICD-10-CM | POA: Diagnosis not present

## 2017-11-27 DIAGNOSIS — K219 Gastro-esophageal reflux disease without esophagitis: Secondary | ICD-10-CM | POA: Diagnosis not present

## 2017-11-27 DIAGNOSIS — E785 Hyperlipidemia, unspecified: Secondary | ICD-10-CM | POA: Diagnosis not present

## 2017-11-27 DIAGNOSIS — Z6830 Body mass index (BMI) 30.0-30.9, adult: Secondary | ICD-10-CM | POA: Diagnosis not present

## 2018-02-12 DIAGNOSIS — R05 Cough: Secondary | ICD-10-CM | POA: Diagnosis not present

## 2018-02-12 DIAGNOSIS — R0600 Dyspnea, unspecified: Secondary | ICD-10-CM | POA: Diagnosis not present

## 2018-02-18 DIAGNOSIS — R0600 Dyspnea, unspecified: Secondary | ICD-10-CM | POA: Diagnosis not present

## 2018-03-03 DIAGNOSIS — E7800 Pure hypercholesterolemia, unspecified: Secondary | ICD-10-CM | POA: Diagnosis not present

## 2018-03-03 DIAGNOSIS — R7309 Other abnormal glucose: Secondary | ICD-10-CM | POA: Diagnosis not present

## 2018-03-03 DIAGNOSIS — M810 Age-related osteoporosis without current pathological fracture: Secondary | ICD-10-CM | POA: Diagnosis not present

## 2018-03-03 DIAGNOSIS — K219 Gastro-esophageal reflux disease without esophagitis: Secondary | ICD-10-CM | POA: Diagnosis not present

## 2018-03-18 DIAGNOSIS — R739 Hyperglycemia, unspecified: Secondary | ICD-10-CM | POA: Diagnosis not present

## 2018-03-18 DIAGNOSIS — R7309 Other abnormal glucose: Secondary | ICD-10-CM | POA: Diagnosis not present

## 2018-03-18 DIAGNOSIS — E7800 Pure hypercholesterolemia, unspecified: Secondary | ICD-10-CM | POA: Diagnosis not present

## 2018-03-18 DIAGNOSIS — K219 Gastro-esophageal reflux disease without esophagitis: Secondary | ICD-10-CM | POA: Diagnosis not present

## 2018-03-18 DIAGNOSIS — M810 Age-related osteoporosis without current pathological fracture: Secondary | ICD-10-CM | POA: Diagnosis not present

## 2018-03-29 DIAGNOSIS — E785 Hyperlipidemia, unspecified: Secondary | ICD-10-CM | POA: Diagnosis not present

## 2018-03-29 DIAGNOSIS — E0590 Thyrotoxicosis, unspecified without thyrotoxic crisis or storm: Secondary | ICD-10-CM | POA: Diagnosis not present

## 2018-03-29 DIAGNOSIS — Z6830 Body mass index (BMI) 30.0-30.9, adult: Secondary | ICD-10-CM | POA: Diagnosis not present

## 2018-03-29 DIAGNOSIS — Z0000 Encounter for general adult medical examination without abnormal findings: Secondary | ICD-10-CM | POA: Diagnosis not present

## 2018-04-05 DIAGNOSIS — E041 Nontoxic single thyroid nodule: Secondary | ICD-10-CM | POA: Diagnosis not present

## 2018-04-05 DIAGNOSIS — E0590 Thyrotoxicosis, unspecified without thyrotoxic crisis or storm: Secondary | ICD-10-CM | POA: Diagnosis not present

## 2018-04-15 DIAGNOSIS — R7989 Other specified abnormal findings of blood chemistry: Secondary | ICD-10-CM | POA: Diagnosis not present

## 2018-04-16 DIAGNOSIS — E041 Nontoxic single thyroid nodule: Secondary | ICD-10-CM | POA: Diagnosis not present

## 2018-04-16 DIAGNOSIS — E0590 Thyrotoxicosis, unspecified without thyrotoxic crisis or storm: Secondary | ICD-10-CM | POA: Diagnosis not present

## 2018-06-04 DIAGNOSIS — E0510 Thyrotoxicosis with toxic single thyroid nodule without thyrotoxic crisis or storm: Secondary | ICD-10-CM | POA: Diagnosis not present

## 2018-06-19 DIAGNOSIS — R300 Dysuria: Secondary | ICD-10-CM | POA: Diagnosis not present

## 2018-06-19 DIAGNOSIS — B9689 Other specified bacterial agents as the cause of diseases classified elsewhere: Secondary | ICD-10-CM | POA: Diagnosis not present

## 2018-06-19 DIAGNOSIS — N390 Urinary tract infection, site not specified: Secondary | ICD-10-CM | POA: Diagnosis not present

## 2018-06-19 DIAGNOSIS — I10 Essential (primary) hypertension: Secondary | ICD-10-CM | POA: Diagnosis not present

## 2018-06-29 DIAGNOSIS — R05 Cough: Secondary | ICD-10-CM | POA: Diagnosis not present

## 2018-07-20 DIAGNOSIS — E0510 Thyrotoxicosis with toxic single thyroid nodule without thyrotoxic crisis or storm: Secondary | ICD-10-CM | POA: Diagnosis not present

## 2018-07-27 DIAGNOSIS — R05 Cough: Secondary | ICD-10-CM | POA: Diagnosis not present

## 2018-07-27 DIAGNOSIS — R0600 Dyspnea, unspecified: Secondary | ICD-10-CM | POA: Diagnosis not present

## 2018-08-03 DIAGNOSIS — Z23 Encounter for immunization: Secondary | ICD-10-CM | POA: Diagnosis not present

## 2018-09-20 DIAGNOSIS — M545 Low back pain: Secondary | ICD-10-CM | POA: Diagnosis not present

## 2018-09-21 DIAGNOSIS — M549 Dorsalgia, unspecified: Secondary | ICD-10-CM | POA: Diagnosis not present

## 2018-10-11 DIAGNOSIS — E0590 Thyrotoxicosis, unspecified without thyrotoxic crisis or storm: Secondary | ICD-10-CM | POA: Diagnosis not present

## 2018-12-10 DIAGNOSIS — E0510 Thyrotoxicosis with toxic single thyroid nodule without thyrotoxic crisis or storm: Secondary | ICD-10-CM | POA: Diagnosis not present

## 2019-01-11 DIAGNOSIS — E0510 Thyrotoxicosis with toxic single thyroid nodule without thyrotoxic crisis or storm: Secondary | ICD-10-CM | POA: Diagnosis not present

## 2019-03-24 DIAGNOSIS — E0590 Thyrotoxicosis, unspecified without thyrotoxic crisis or storm: Secondary | ICD-10-CM | POA: Diagnosis not present

## 2019-03-24 DIAGNOSIS — E785 Hyperlipidemia, unspecified: Secondary | ICD-10-CM | POA: Diagnosis not present

## 2019-04-04 DIAGNOSIS — Z0000 Encounter for general adult medical examination without abnormal findings: Secondary | ICD-10-CM | POA: Diagnosis not present

## 2019-04-04 DIAGNOSIS — E0590 Thyrotoxicosis, unspecified without thyrotoxic crisis or storm: Secondary | ICD-10-CM | POA: Diagnosis not present

## 2019-04-04 DIAGNOSIS — Z01419 Encounter for gynecological examination (general) (routine) without abnormal findings: Secondary | ICD-10-CM | POA: Diagnosis not present

## 2019-04-04 DIAGNOSIS — Z124 Encounter for screening for malignant neoplasm of cervix: Secondary | ICD-10-CM | POA: Diagnosis not present

## 2019-04-22 ENCOUNTER — Other Ambulatory Visit: Payer: Self-pay

## 2019-06-30 DIAGNOSIS — E0510 Thyrotoxicosis with toxic single thyroid nodule without thyrotoxic crisis or storm: Secondary | ICD-10-CM | POA: Diagnosis not present

## 2019-07-18 DIAGNOSIS — Z780 Asymptomatic menopausal state: Secondary | ICD-10-CM | POA: Diagnosis not present

## 2019-07-18 DIAGNOSIS — Z1231 Encounter for screening mammogram for malignant neoplasm of breast: Secondary | ICD-10-CM | POA: Diagnosis not present

## 2019-07-18 DIAGNOSIS — E0510 Thyrotoxicosis with toxic single thyroid nodule without thyrotoxic crisis or storm: Secondary | ICD-10-CM | POA: Diagnosis not present

## 2019-07-18 DIAGNOSIS — E042 Nontoxic multinodular goiter: Secondary | ICD-10-CM | POA: Diagnosis not present

## 2019-08-09 DIAGNOSIS — Z23 Encounter for immunization: Secondary | ICD-10-CM | POA: Diagnosis not present

## 2019-08-17 DIAGNOSIS — B349 Viral infection, unspecified: Secondary | ICD-10-CM | POA: Diagnosis not present

## 2019-08-17 DIAGNOSIS — Z20828 Contact with and (suspected) exposure to other viral communicable diseases: Secondary | ICD-10-CM | POA: Diagnosis not present

## 2019-08-19 DIAGNOSIS — B349 Viral infection, unspecified: Secondary | ICD-10-CM | POA: Diagnosis not present

## 2019-10-15 IMAGING — CT CT HEART MORP W/ CTA COR W/ SCORE W/ CA W/CM &/OR W/O CM
4 of 7 series · 8 of 20 positions shown, 9 images · non-contrast
Comparison: None.

CLINICAL DATA: Chest pain

EXAM:
Cardiac CTA
MEDICATIONS:
Sub lingual nitro. 4mg x 2 and lopressor 5mg IV x 1
TECHNIQUE: The patient was scanned on a Siemens [REDACTED]ice scanner. Gantry
rotation speed was 240 msecs. Collimation was 0.6 mm. A 100 kV
prospective scan was triggered in the ascending thoracic aorta at
111 HU's with 35-75% of the R-R interval. Average HR during the scan
was 60 bpm. The 3D data set was interpreted on a dedicated work
station using MPR, MIP and VRT modes. A total of 80cc of contrast
was used.

[Series 6: best diast 74 % · axial · 0.30mm/px · z∈[-454,-410]mm · 2 of 331 slices shown, 3 images]
[im 111/331  vessel]
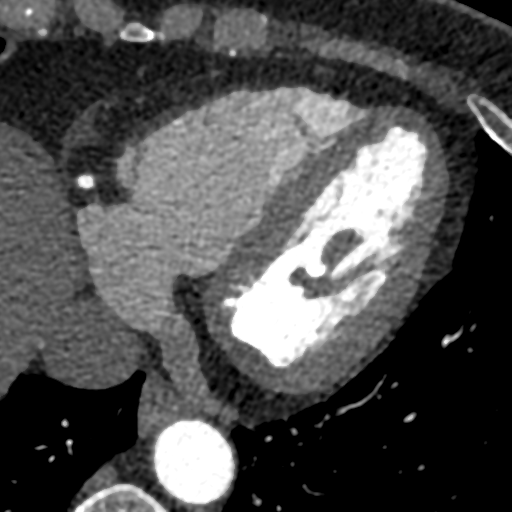
[im 111/331  lung]
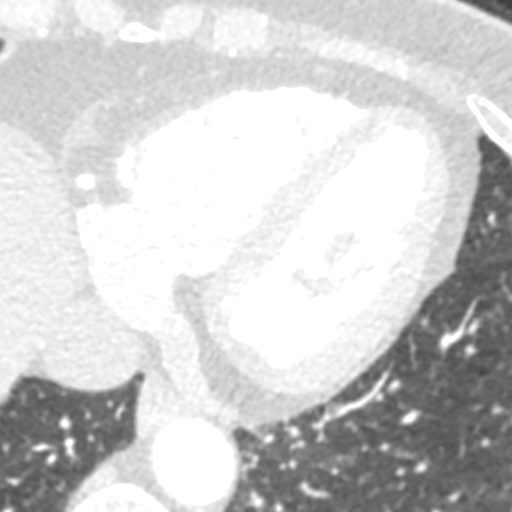
[im 221/331  vessel]
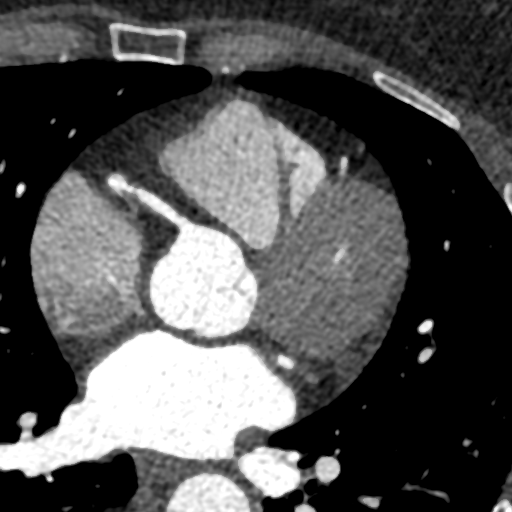

[Series 7: best syst 42 % · axial · 0.30mm/px · z∈[-454,-410]mm · 2 of 331 slices shown]
[im 111/331  vessel]
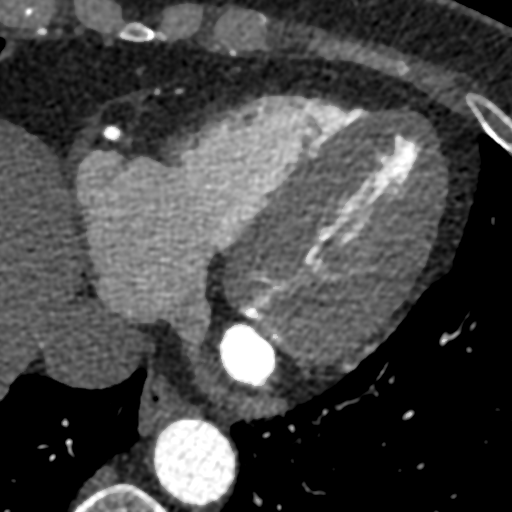
[im 221/331  vessel]
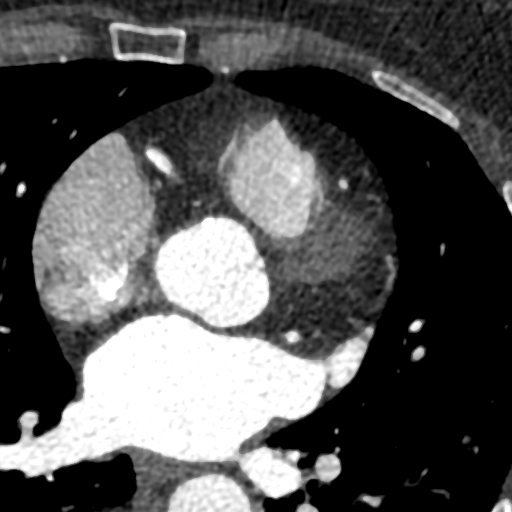

[Series 8: ts diast sharp 74 % · axial · 0.30mm/px · z∈[-454,-410]mm · 2 of 331 slices shown]
[im 111/331  lung]
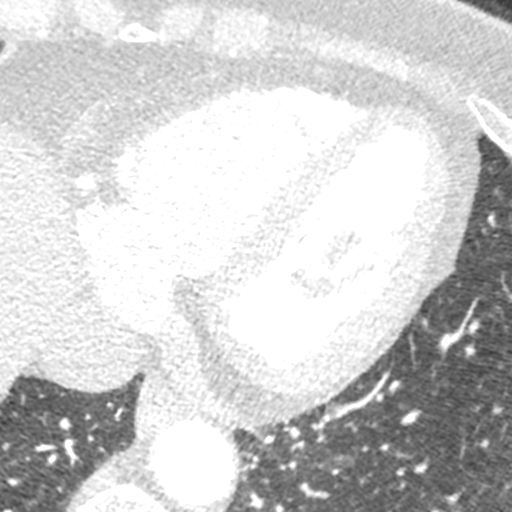
[im 221/331  lung]
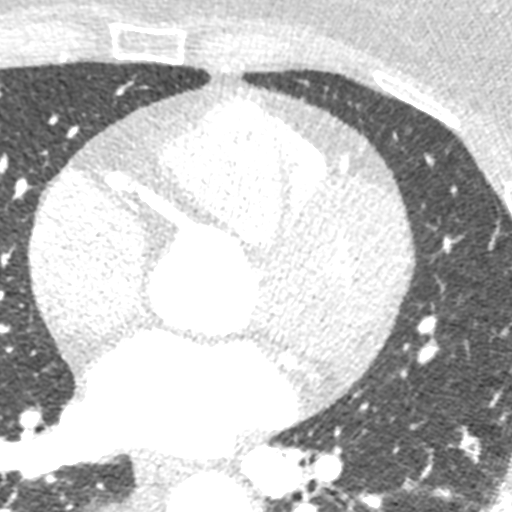

[Series 9: ts syst sharp 42 % · axial · 0.30mm/px · z∈[-454,-410]mm · 2 of 331 slices shown]
[im 111/331  lung]
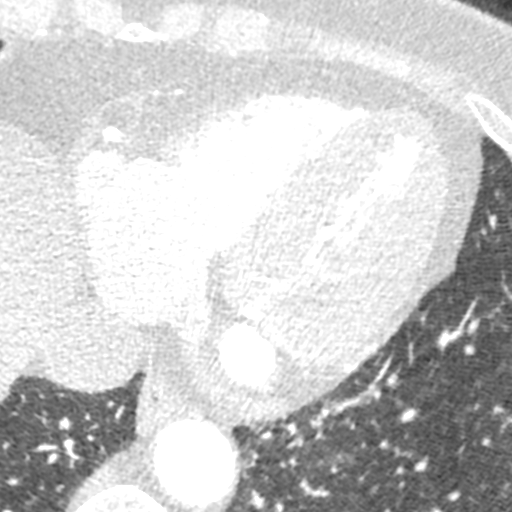
[im 221/331  lung]
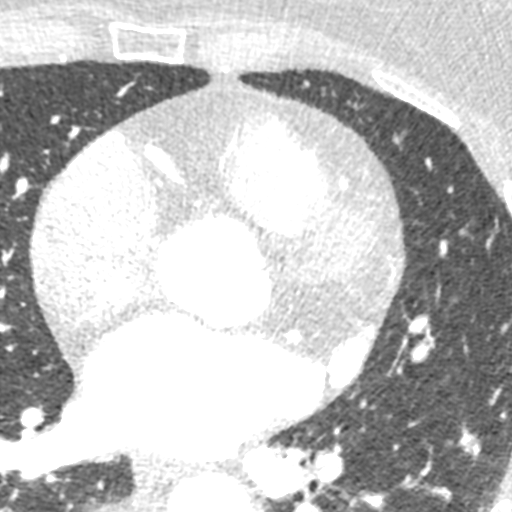

[8 of 20 positions shown; findings below may reference images not displayed]

FINDINGS: Non-cardiac: See separate report from [REDACTED].

Calcium Score: 2 Agatston units.

Coronary Arteries: Right dominant with no anomalies

LM: No plaque or stenosis.

LAD system: The distal LAD is small in caliber. No significant
plaque or stenosis noted.

Circumflex system: Large PLOM.  No plaque or stenosis in LCx system.

RCA system: No significant plaque or stenosis.
IMPRESSION: 1. Coronary artery calcium score of 2 Agatston units, placing the
patient in the 49th percentile for age and gender, suggesting
intermediate risk for future cardiac events.

2.  No obstructive coronary disease noted.

Abu Mina Khana

EXAM:
OVER-READ INTERPRETATION  CT CHEST

The following report is an over-read performed by radiologist Dr.
Devny Tuibua [REDACTED] on 10/26/2017. This over-read
does not include interpretation of cardiac or coronary anatomy or
pathology. The coronary CTA interpretation by the cardiologist is
attached.
FINDINGS: Vascular: Heart is normal size.  Visualized aorta normal caliber.

Mediastinum/Nodes: No adenopathy in the lower mediastinum or hila.
Small hiatal hernia.

Lungs/Pleura: Visualized lungs clear.  No effusions.

Upper Abdomen: Imaging into the upper abdomen shows no acute
findings.

Musculoskeletal: Chest wall soft tissues are unremarkable. No acute
bony abnormality.
IMPRESSION: No acute extracardiac abnormality.

Small hiatal hernia.

## 2019-12-09 DIAGNOSIS — Z23 Encounter for immunization: Secondary | ICD-10-CM | POA: Diagnosis not present

## 2019-12-28 DIAGNOSIS — R1012 Left upper quadrant pain: Secondary | ICD-10-CM | POA: Diagnosis not present

## 2019-12-28 DIAGNOSIS — E0510 Thyrotoxicosis with toxic single thyroid nodule without thyrotoxic crisis or storm: Secondary | ICD-10-CM | POA: Diagnosis not present

## 2019-12-29 DIAGNOSIS — D229 Melanocytic nevi, unspecified: Secondary | ICD-10-CM | POA: Diagnosis not present

## 2019-12-29 DIAGNOSIS — R030 Elevated blood-pressure reading, without diagnosis of hypertension: Secondary | ICD-10-CM | POA: Diagnosis not present

## 2019-12-29 DIAGNOSIS — R1012 Left upper quadrant pain: Secondary | ICD-10-CM | POA: Diagnosis not present

## 2019-12-29 DIAGNOSIS — R3911 Hesitancy of micturition: Secondary | ICD-10-CM | POA: Diagnosis not present

## 2019-12-29 DIAGNOSIS — K589 Irritable bowel syndrome without diarrhea: Secondary | ICD-10-CM | POA: Diagnosis not present

## 2020-01-05 DIAGNOSIS — E0510 Thyrotoxicosis with toxic single thyroid nodule without thyrotoxic crisis or storm: Secondary | ICD-10-CM | POA: Diagnosis not present

## 2020-01-09 DIAGNOSIS — R1012 Left upper quadrant pain: Secondary | ICD-10-CM | POA: Diagnosis not present

## 2020-01-10 DIAGNOSIS — Z23 Encounter for immunization: Secondary | ICD-10-CM | POA: Diagnosis not present

## 2020-01-13 DIAGNOSIS — R1012 Left upper quadrant pain: Secondary | ICD-10-CM | POA: Diagnosis not present

## 2020-01-13 DIAGNOSIS — K589 Irritable bowel syndrome without diarrhea: Secondary | ICD-10-CM | POA: Diagnosis not present

## 2020-02-28 DIAGNOSIS — Z808 Family history of malignant neoplasm of other organs or systems: Secondary | ICD-10-CM | POA: Diagnosis not present

## 2020-02-28 DIAGNOSIS — D485 Neoplasm of uncertain behavior of skin: Secondary | ICD-10-CM | POA: Diagnosis not present

## 2020-02-28 DIAGNOSIS — C44519 Basal cell carcinoma of skin of other part of trunk: Secondary | ICD-10-CM | POA: Diagnosis not present

## 2020-02-28 DIAGNOSIS — D229 Melanocytic nevi, unspecified: Secondary | ICD-10-CM | POA: Diagnosis not present

## 2020-02-28 DIAGNOSIS — L821 Other seborrheic keratosis: Secondary | ICD-10-CM | POA: Diagnosis not present

## 2020-02-28 DIAGNOSIS — D235 Other benign neoplasm of skin of trunk: Secondary | ICD-10-CM | POA: Diagnosis not present

## 2020-02-28 DIAGNOSIS — D225 Melanocytic nevi of trunk: Secondary | ICD-10-CM | POA: Diagnosis not present

## 2020-04-09 DIAGNOSIS — L821 Other seborrheic keratosis: Secondary | ICD-10-CM | POA: Diagnosis not present

## 2020-04-09 DIAGNOSIS — D225 Melanocytic nevi of trunk: Secondary | ICD-10-CM | POA: Diagnosis not present

## 2020-04-09 DIAGNOSIS — L578 Other skin changes due to chronic exposure to nonionizing radiation: Secondary | ICD-10-CM | POA: Diagnosis not present

## 2020-04-12 DIAGNOSIS — E559 Vitamin D deficiency, unspecified: Secondary | ICD-10-CM | POA: Diagnosis not present

## 2020-04-12 DIAGNOSIS — K580 Irritable bowel syndrome with diarrhea: Secondary | ICD-10-CM | POA: Diagnosis not present

## 2020-04-12 DIAGNOSIS — K219 Gastro-esophageal reflux disease without esophagitis: Secondary | ICD-10-CM | POA: Diagnosis not present

## 2020-04-12 DIAGNOSIS — Z6828 Body mass index (BMI) 28.0-28.9, adult: Secondary | ICD-10-CM | POA: Diagnosis not present

## 2020-04-12 DIAGNOSIS — E0590 Thyrotoxicosis, unspecified without thyrotoxic crisis or storm: Secondary | ICD-10-CM | POA: Diagnosis not present

## 2020-05-14 DIAGNOSIS — Z1231 Encounter for screening mammogram for malignant neoplasm of breast: Secondary | ICD-10-CM | POA: Diagnosis not present

## 2020-05-14 DIAGNOSIS — Z79899 Other long term (current) drug therapy: Secondary | ICD-10-CM | POA: Diagnosis not present

## 2020-05-14 DIAGNOSIS — K219 Gastro-esophageal reflux disease without esophagitis: Secondary | ICD-10-CM | POA: Diagnosis not present

## 2020-05-14 DIAGNOSIS — R739 Hyperglycemia, unspecified: Secondary | ICD-10-CM | POA: Diagnosis not present

## 2020-05-14 DIAGNOSIS — Z6828 Body mass index (BMI) 28.0-28.9, adult: Secondary | ICD-10-CM | POA: Diagnosis not present

## 2020-05-14 DIAGNOSIS — E0590 Thyrotoxicosis, unspecified without thyrotoxic crisis or storm: Secondary | ICD-10-CM | POA: Diagnosis not present

## 2020-05-14 DIAGNOSIS — E559 Vitamin D deficiency, unspecified: Secondary | ICD-10-CM | POA: Diagnosis not present

## 2020-05-14 DIAGNOSIS — E785 Hyperlipidemia, unspecified: Secondary | ICD-10-CM | POA: Diagnosis not present

## 2020-05-14 DIAGNOSIS — K580 Irritable bowel syndrome with diarrhea: Secondary | ICD-10-CM | POA: Diagnosis not present

## 2020-05-17 DIAGNOSIS — R739 Hyperglycemia, unspecified: Secondary | ICD-10-CM | POA: Diagnosis not present

## 2020-05-17 DIAGNOSIS — E785 Hyperlipidemia, unspecified: Secondary | ICD-10-CM | POA: Diagnosis not present

## 2020-05-17 DIAGNOSIS — E0590 Thyrotoxicosis, unspecified without thyrotoxic crisis or storm: Secondary | ICD-10-CM | POA: Diagnosis not present

## 2020-05-17 DIAGNOSIS — E041 Nontoxic single thyroid nodule: Secondary | ICD-10-CM | POA: Diagnosis not present

## 2020-05-21 DIAGNOSIS — Z79899 Other long term (current) drug therapy: Secondary | ICD-10-CM | POA: Diagnosis not present

## 2020-05-21 DIAGNOSIS — E0590 Thyrotoxicosis, unspecified without thyrotoxic crisis or storm: Secondary | ICD-10-CM | POA: Diagnosis not present

## 2020-06-14 DIAGNOSIS — E041 Nontoxic single thyroid nodule: Secondary | ICD-10-CM | POA: Diagnosis not present

## 2020-07-16 DIAGNOSIS — D2239 Melanocytic nevi of other parts of face: Secondary | ICD-10-CM | POA: Diagnosis not present

## 2020-07-16 DIAGNOSIS — C44519 Basal cell carcinoma of skin of other part of trunk: Secondary | ICD-10-CM | POA: Diagnosis not present

## 2020-07-16 DIAGNOSIS — L309 Dermatitis, unspecified: Secondary | ICD-10-CM | POA: Diagnosis not present

## 2020-07-18 DIAGNOSIS — E0590 Thyrotoxicosis, unspecified without thyrotoxic crisis or storm: Secondary | ICD-10-CM | POA: Diagnosis not present

## 2020-07-18 DIAGNOSIS — E559 Vitamin D deficiency, unspecified: Secondary | ICD-10-CM | POA: Diagnosis not present

## 2020-07-18 DIAGNOSIS — Z1231 Encounter for screening mammogram for malignant neoplasm of breast: Secondary | ICD-10-CM | POA: Diagnosis not present

## 2020-07-23 DIAGNOSIS — Z23 Encounter for immunization: Secondary | ICD-10-CM | POA: Diagnosis not present

## 2020-07-25 DIAGNOSIS — E785 Hyperlipidemia, unspecified: Secondary | ICD-10-CM | POA: Diagnosis not present

## 2020-07-25 DIAGNOSIS — E041 Nontoxic single thyroid nodule: Secondary | ICD-10-CM | POA: Diagnosis not present

## 2020-07-25 DIAGNOSIS — R739 Hyperglycemia, unspecified: Secondary | ICD-10-CM | POA: Diagnosis not present

## 2020-07-25 DIAGNOSIS — E0590 Thyrotoxicosis, unspecified without thyrotoxic crisis or storm: Secondary | ICD-10-CM | POA: Diagnosis not present

## 2020-09-06 DIAGNOSIS — I889 Nonspecific lymphadenitis, unspecified: Secondary | ICD-10-CM | POA: Diagnosis not present

## 2020-09-06 DIAGNOSIS — M549 Dorsalgia, unspecified: Secondary | ICD-10-CM | POA: Diagnosis not present

## 2020-09-06 DIAGNOSIS — Z6829 Body mass index (BMI) 29.0-29.9, adult: Secondary | ICD-10-CM | POA: Diagnosis not present

## 2020-09-06 DIAGNOSIS — E785 Hyperlipidemia, unspecified: Secondary | ICD-10-CM | POA: Diagnosis not present

## 2020-10-30 DIAGNOSIS — R5383 Other fatigue: Secondary | ICD-10-CM | POA: Diagnosis not present

## 2020-10-30 DIAGNOSIS — R059 Cough, unspecified: Secondary | ICD-10-CM | POA: Diagnosis not present

## 2020-10-30 DIAGNOSIS — R002 Palpitations: Secondary | ICD-10-CM | POA: Diagnosis not present

## 2020-10-30 DIAGNOSIS — Z6830 Body mass index (BMI) 30.0-30.9, adult: Secondary | ICD-10-CM | POA: Diagnosis not present

## 2020-10-30 DIAGNOSIS — R5381 Other malaise: Secondary | ICD-10-CM | POA: Diagnosis not present

## 2020-10-30 DIAGNOSIS — E0590 Thyrotoxicosis, unspecified without thyrotoxic crisis or storm: Secondary | ICD-10-CM | POA: Diagnosis not present

## 2020-10-30 DIAGNOSIS — J4520 Mild intermittent asthma, uncomplicated: Secondary | ICD-10-CM | POA: Diagnosis not present

## 2020-11-05 ENCOUNTER — Encounter: Payer: Self-pay | Admitting: Cardiology

## 2020-11-05 ENCOUNTER — Encounter: Payer: Self-pay | Admitting: *Deleted

## 2020-11-13 DIAGNOSIS — E0590 Thyrotoxicosis, unspecified without thyrotoxic crisis or storm: Secondary | ICD-10-CM | POA: Diagnosis not present

## 2020-11-13 DIAGNOSIS — E559 Vitamin D deficiency, unspecified: Secondary | ICD-10-CM | POA: Diagnosis not present

## 2020-11-22 DIAGNOSIS — M8580 Other specified disorders of bone density and structure, unspecified site: Secondary | ICD-10-CM | POA: Diagnosis not present

## 2020-11-22 DIAGNOSIS — E785 Hyperlipidemia, unspecified: Secondary | ICD-10-CM | POA: Diagnosis not present

## 2020-11-22 DIAGNOSIS — R739 Hyperglycemia, unspecified: Secondary | ICD-10-CM | POA: Diagnosis not present

## 2020-11-22 DIAGNOSIS — E559 Vitamin D deficiency, unspecified: Secondary | ICD-10-CM | POA: Diagnosis not present

## 2020-11-22 DIAGNOSIS — E041 Nontoxic single thyroid nodule: Secondary | ICD-10-CM | POA: Diagnosis not present

## 2020-11-22 DIAGNOSIS — E0590 Thyrotoxicosis, unspecified without thyrotoxic crisis or storm: Secondary | ICD-10-CM | POA: Diagnosis not present

## 2020-11-26 DIAGNOSIS — R002 Palpitations: Secondary | ICD-10-CM | POA: Insufficient documentation

## 2020-11-26 DIAGNOSIS — T7840XA Allergy, unspecified, initial encounter: Secondary | ICD-10-CM | POA: Insufficient documentation

## 2020-11-26 DIAGNOSIS — K219 Gastro-esophageal reflux disease without esophagitis: Secondary | ICD-10-CM | POA: Insufficient documentation

## 2020-12-03 NOTE — Progress Notes (Signed)
Cardiology Office Note:    Date:  12/04/2020   ID:  Sheri Love, DOB 1950-10-30, MRN [id number]  PCP:  Nicoletta Dress, MD  Cardiologist:  Shirlee More, MD    Referring MD: Lowella Dandy, NP    ASSESSMENT:    1. Palpitations   2. Hyperthyroidism   3. Dyslipidemia    PLAN:    In order of problems listed above:  1. We both feel that her symptoms were a late sequelae of COVID-19 that are resolved at this time and things value an event monitor asked her to sign up for my chart and if it recurs and is frequent and bothersome will use a 7 to 14-day event monitor. 2. Stable thyroid disease continue her current thyroid suppressant Tapazole 3. Lipids are stable continue her current high intensity statin   Next appointment: As needed she will sign up for my chart   Medication Adjustments/Labs and Tests Ordered: Current medicines are reviewed at length with the patient today.  Concerns regarding medicines are outlined above.  Orders Placed This Encounter  Procedures  . EKG 12-Lead   No orders of the defined types were placed in this encounter.   No chief complaint on file.   History of Present Illness:   Sheri Love is a 70 y.o. female with a hx of chest pain coronary artery calcium score of 2 and absence of CAD on cardiac CTA 10/27/2017 last seen 10/27/2017. She had COVID-19 in December and afterwards was quite weak fatigued and had palpitation not severe not sustained and not in the last few weeks.  Recent thyroid study showed normal TSH 0.954 T4 1.11 and T3 3.7 on a stable dose of Tapazole.  Her strength and endurance have recovered no angina dyspnea or syncope. Compliance with diet, lifestyle and medications: Yes  Recent labs PCP 11/05/2020 hemoglobin 14.2 hematocrit 41.8 platelets 290,000, creatinine 0.94 GFR 62 cc sodium 140 potassium 4.2.  Notation that since Covid infection there has been increasing palpitation and a recommendation to be evaluated by cardiology also  noted she has hyperthyroidism and is followed by endocrinology.  She had a chest x-ray 10/30/2020 read as normal. Past Medical History:  Diagnosis Date  . Allergy    cold climate and seasonal  . Anxiety 10/13/2017  . Asthma    cold climate  . Chest pain in adult 10/14/2017  . Dyslipidemia 10/13/2017  . GERD (gastroesophageal reflux disease)   . Hyperglycemia 10/13/2017  . Hyperthyroidism 10/13/2017  . Lumbago 10/13/2017  . Malaise and fatigue 10/13/2017  . Mild intermittent asthma without complication 02/21/931  . Osteopenia 10/13/2017  . Palpitations   . SOB (shortness of breath) on exertion 10/14/2017  . Vitamin D deficiency 10/13/2017    Past Surgical History:  Procedure Laterality Date  . BACK SURGERY  10/1998   lower back  . CHOLECYSTECTOMY  2014  . COLONOSCOPY    . ENDOMETRIAL ABLATION  2004  . TOOTH EXTRACTION    . TUBAL LIGATION  1990's    Current Medications: Current Meds  Medication Sig  . aspirin EC 81 MG tablet Take 81 mg by mouth daily. Swallow whole.  . ibuprofen (ADVIL) 600 MG tablet Take 600 mg by mouth every 8 (eight) hours as needed for mild pain.  . methimazole (TAPAZOLE) 10 MG tablet Take 10 mg by mouth daily.  . Multiple Vitamins-Minerals (WOMENS 50+ MULTI VITAMIN/MIN) TABS Take 1 tablet by mouth daily.  . pantoprazole (PROTONIX) 20 MG tablet Take 20 mg  by mouth daily.   . rosuvastatin (CRESTOR) 5 MG tablet Take 5 mg by mouth daily.  . Zinc 50 MG TABS Take 1 tablet by mouth daily.     Allergies:   Erythromycin   Social History   Socioeconomic History  . Marital status: Married    Spouse name: Not on file  . Number of children: Not on file  . Years of education: Not on file  . Highest education level: Not on file  Occupational History  . Not on file  Tobacco Use  . Smoking status: Never Smoker  . Smokeless tobacco: Never Used  Vaping Use  . Vaping Use: Never used  Substance and Sexual Activity  . Alcohol use: Yes    Comment: rarely wine  .  Drug use: No  . Sexual activity: Not on file  Other Topics Concern  . Not on file  Social History Narrative  . Not on file   Social Determinants of Health   Financial Resource Strain: Not on file  Food Insecurity: Not on file  Transportation Needs: Not on file  Physical Activity: Not on file  Stress: Not on file  Social Connections: Not on file     Family History: The patient's family history includes Atrial fibrillation in her brother; Colon cancer in her mother; Diabetes in her maternal grandmother and mother; Heart attack in her father; Heart disease in her brother, father, and mother; Hyperlipidemia in her father and mother; Hypertension in her father and mother; Liver cancer in her mother; Pancreatic cancer in her mother; Stomach cancer in her maternal grandfather. ROS:   Please see the history of present illness.    All other systems reviewed and are negative.  EKGs/Labs/Other Studies Reviewed:    The following studies were reviewed today:  EKG:  EKG ordered today and personally reviewed.  The ekg ordered today demonstrates sinus rhythm normal EKG   Physical Exam:    VS:  BP 130/60   Pulse 70   Ht 5\' 6"  (1.676 m)   Wt 183 lb 12.8 oz (83.4 kg)   SpO2 98%   BMI 29.67 kg/m     Wt Readings from Last 3 Encounters:  12/04/20 183 lb 12.8 oz (83.4 kg)  10/30/20 182 lb (82.6 kg)  11/02/17 183 lb 6.4 oz (83.2 kg)     GEN:  Well nourished, well developed in no acute distress HEENT: Normal NECK: No JVD; No carotid bruits LYMPHATICS: No lymphadenopathy CARDIAC: RRR, no murmurs, rubs, gallops RESPIRATORY:  Clear to auscultation without rales, wheezing or rhonchi  ABDOMEN: Soft, non-tender, non-distended MUSCULOSKELETAL:  No edema; No deformity  SKIN: Warm and dry NEUROLOGIC:  Alert and oriented x 3 PSYCHIATRIC:  Normal affect    Signed, Shirlee More, MD  12/04/2020 2:44 PM    [facility]

## 2020-12-04 ENCOUNTER — Other Ambulatory Visit: Payer: Self-pay

## 2020-12-04 ENCOUNTER — Encounter: Payer: Self-pay | Admitting: Cardiology

## 2020-12-04 ENCOUNTER — Ambulatory Visit (INDEPENDENT_AMBULATORY_CARE_PROVIDER_SITE_OTHER): Payer: Medicare Other | Admitting: Cardiology

## 2020-12-04 VITALS — BP 130/60 | HR 70 | Ht 66.0 in | Wt 183.8 lb

## 2020-12-04 DIAGNOSIS — R002 Palpitations: Secondary | ICD-10-CM

## 2020-12-04 DIAGNOSIS — E0590 Thyrotoxicosis, unspecified without thyrotoxic crisis or storm: Secondary | ICD-10-CM

## 2020-12-04 DIAGNOSIS — E785 Hyperlipidemia, unspecified: Secondary | ICD-10-CM

## 2020-12-04 NOTE — Patient Instructions (Signed)

## 2021-01-31 DIAGNOSIS — Z1331 Encounter for screening for depression: Secondary | ICD-10-CM | POA: Diagnosis not present

## 2021-01-31 DIAGNOSIS — Z79899 Other long term (current) drug therapy: Secondary | ICD-10-CM | POA: Diagnosis not present

## 2021-01-31 DIAGNOSIS — K580 Irritable bowel syndrome with diarrhea: Secondary | ICD-10-CM | POA: Diagnosis not present

## 2021-01-31 DIAGNOSIS — Z6830 Body mass index (BMI) 30.0-30.9, adult: Secondary | ICD-10-CM | POA: Diagnosis not present

## 2021-01-31 DIAGNOSIS — R739 Hyperglycemia, unspecified: Secondary | ICD-10-CM | POA: Diagnosis not present

## 2021-01-31 DIAGNOSIS — E559 Vitamin D deficiency, unspecified: Secondary | ICD-10-CM | POA: Diagnosis not present

## 2021-01-31 DIAGNOSIS — E0590 Thyrotoxicosis, unspecified without thyrotoxic crisis or storm: Secondary | ICD-10-CM | POA: Diagnosis not present

## 2021-01-31 DIAGNOSIS — E785 Hyperlipidemia, unspecified: Secondary | ICD-10-CM | POA: Diagnosis not present

## 2021-01-31 DIAGNOSIS — K219 Gastro-esophageal reflux disease without esophagitis: Secondary | ICD-10-CM | POA: Diagnosis not present

## 2021-03-21 DIAGNOSIS — E0590 Thyrotoxicosis, unspecified without thyrotoxic crisis or storm: Secondary | ICD-10-CM | POA: Diagnosis not present

## 2021-04-01 DIAGNOSIS — E0590 Thyrotoxicosis, unspecified without thyrotoxic crisis or storm: Secondary | ICD-10-CM | POA: Diagnosis not present

## 2021-04-01 DIAGNOSIS — E785 Hyperlipidemia, unspecified: Secondary | ICD-10-CM | POA: Diagnosis not present

## 2021-04-01 DIAGNOSIS — R739 Hyperglycemia, unspecified: Secondary | ICD-10-CM | POA: Diagnosis not present

## 2021-04-01 DIAGNOSIS — E559 Vitamin D deficiency, unspecified: Secondary | ICD-10-CM | POA: Diagnosis not present

## 2021-04-01 DIAGNOSIS — E041 Nontoxic single thyroid nodule: Secondary | ICD-10-CM | POA: Diagnosis not present

## 2021-04-01 DIAGNOSIS — M8580 Other specified disorders of bone density and structure, unspecified site: Secondary | ICD-10-CM | POA: Diagnosis not present

## 2021-04-09 DIAGNOSIS — K219 Gastro-esophageal reflux disease without esophagitis: Secondary | ICD-10-CM | POA: Diagnosis not present

## 2021-04-09 DIAGNOSIS — N644 Mastodynia: Secondary | ICD-10-CM | POA: Diagnosis not present

## 2021-04-18 DIAGNOSIS — N644 Mastodynia: Secondary | ICD-10-CM | POA: Diagnosis not present

## 2021-04-18 DIAGNOSIS — R922 Inconclusive mammogram: Secondary | ICD-10-CM | POA: Diagnosis not present

## 2021-05-09 DIAGNOSIS — K219 Gastro-esophageal reflux disease without esophagitis: Secondary | ICD-10-CM | POA: Diagnosis not present

## 2021-05-09 DIAGNOSIS — K649 Unspecified hemorrhoids: Secondary | ICD-10-CM | POA: Diagnosis not present

## 2021-05-09 DIAGNOSIS — R079 Chest pain, unspecified: Secondary | ICD-10-CM | POA: Diagnosis not present

## 2021-05-21 DIAGNOSIS — J029 Acute pharyngitis, unspecified: Secondary | ICD-10-CM | POA: Diagnosis not present

## 2021-05-21 DIAGNOSIS — R030 Elevated blood-pressure reading, without diagnosis of hypertension: Secondary | ICD-10-CM | POA: Diagnosis not present

## 2021-06-24 DIAGNOSIS — E041 Nontoxic single thyroid nodule: Secondary | ICD-10-CM | POA: Diagnosis not present

## 2021-07-31 DIAGNOSIS — Z23 Encounter for immunization: Secondary | ICD-10-CM | POA: Diagnosis not present

## 2021-08-07 DIAGNOSIS — D485 Neoplasm of uncertain behavior of skin: Secondary | ICD-10-CM | POA: Diagnosis not present

## 2021-08-07 DIAGNOSIS — H43813 Vitreous degeneration, bilateral: Secondary | ICD-10-CM | POA: Diagnosis not present

## 2021-08-07 DIAGNOSIS — L219 Seborrheic dermatitis, unspecified: Secondary | ICD-10-CM | POA: Diagnosis not present

## 2021-08-07 DIAGNOSIS — H52221 Regular astigmatism, right eye: Secondary | ICD-10-CM | POA: Diagnosis not present

## 2021-08-07 DIAGNOSIS — D225 Melanocytic nevi of trunk: Secondary | ICD-10-CM | POA: Diagnosis not present

## 2021-08-07 DIAGNOSIS — H4303 Vitreous prolapse, bilateral: Secondary | ICD-10-CM | POA: Diagnosis not present

## 2021-08-07 DIAGNOSIS — H35373 Puckering of macula, bilateral: Secondary | ICD-10-CM | POA: Diagnosis not present

## 2021-08-07 DIAGNOSIS — H43393 Other vitreous opacities, bilateral: Secondary | ICD-10-CM | POA: Diagnosis not present

## 2021-08-07 DIAGNOSIS — H524 Presbyopia: Secondary | ICD-10-CM | POA: Diagnosis not present

## 2021-08-07 DIAGNOSIS — L814 Other melanin hyperpigmentation: Secondary | ICD-10-CM | POA: Diagnosis not present

## 2021-08-07 DIAGNOSIS — H25813 Combined forms of age-related cataract, bilateral: Secondary | ICD-10-CM | POA: Diagnosis not present

## 2021-08-07 DIAGNOSIS — L578 Other skin changes due to chronic exposure to nonionizing radiation: Secondary | ICD-10-CM | POA: Diagnosis not present

## 2021-08-07 DIAGNOSIS — H5203 Hypermetropia, bilateral: Secondary | ICD-10-CM | POA: Diagnosis not present

## 2021-08-08 DIAGNOSIS — R739 Hyperglycemia, unspecified: Secondary | ICD-10-CM | POA: Diagnosis not present

## 2021-08-08 DIAGNOSIS — E785 Hyperlipidemia, unspecified: Secondary | ICD-10-CM | POA: Diagnosis not present

## 2021-08-08 DIAGNOSIS — Z79899 Other long term (current) drug therapy: Secondary | ICD-10-CM | POA: Diagnosis not present

## 2021-08-08 DIAGNOSIS — K580 Irritable bowel syndrome with diarrhea: Secondary | ICD-10-CM | POA: Diagnosis not present

## 2021-08-08 DIAGNOSIS — K219 Gastro-esophageal reflux disease without esophagitis: Secondary | ICD-10-CM | POA: Diagnosis not present

## 2021-08-08 DIAGNOSIS — E559 Vitamin D deficiency, unspecified: Secondary | ICD-10-CM | POA: Diagnosis not present

## 2021-08-08 DIAGNOSIS — E0590 Thyrotoxicosis, unspecified without thyrotoxic crisis or storm: Secondary | ICD-10-CM | POA: Diagnosis not present

## 2021-08-08 DIAGNOSIS — Z6830 Body mass index (BMI) 30.0-30.9, adult: Secondary | ICD-10-CM | POA: Diagnosis not present

## 2021-08-23 DIAGNOSIS — E041 Nontoxic single thyroid nodule: Secondary | ICD-10-CM | POA: Diagnosis not present

## 2021-08-23 DIAGNOSIS — E785 Hyperlipidemia, unspecified: Secondary | ICD-10-CM | POA: Diagnosis not present

## 2021-08-23 DIAGNOSIS — E559 Vitamin D deficiency, unspecified: Secondary | ICD-10-CM | POA: Diagnosis not present

## 2021-08-23 DIAGNOSIS — R739 Hyperglycemia, unspecified: Secondary | ICD-10-CM | POA: Diagnosis not present

## 2021-08-23 DIAGNOSIS — M8580 Other specified disorders of bone density and structure, unspecified site: Secondary | ICD-10-CM | POA: Diagnosis not present

## 2021-08-23 DIAGNOSIS — E0590 Thyrotoxicosis, unspecified without thyrotoxic crisis or storm: Secondary | ICD-10-CM | POA: Diagnosis not present

## 2021-10-14 DIAGNOSIS — Z1231 Encounter for screening mammogram for malignant neoplasm of breast: Secondary | ICD-10-CM | POA: Diagnosis not present

## 2021-11-13 DIAGNOSIS — L739 Follicular disorder, unspecified: Secondary | ICD-10-CM | POA: Diagnosis not present

## 2021-11-13 DIAGNOSIS — L820 Inflamed seborrheic keratosis: Secondary | ICD-10-CM | POA: Diagnosis not present

## 2022-02-03 DIAGNOSIS — R079 Chest pain, unspecified: Secondary | ICD-10-CM | POA: Diagnosis not present

## 2022-02-03 DIAGNOSIS — K219 Gastro-esophageal reflux disease without esophagitis: Secondary | ICD-10-CM | POA: Diagnosis not present

## 2022-02-03 DIAGNOSIS — K649 Unspecified hemorrhoids: Secondary | ICD-10-CM | POA: Diagnosis not present

## 2022-02-06 DIAGNOSIS — E0590 Thyrotoxicosis, unspecified without thyrotoxic crisis or storm: Secondary | ICD-10-CM | POA: Diagnosis not present

## 2022-02-06 DIAGNOSIS — Z1331 Encounter for screening for depression: Secondary | ICD-10-CM | POA: Diagnosis not present

## 2022-02-06 DIAGNOSIS — E559 Vitamin D deficiency, unspecified: Secondary | ICD-10-CM | POA: Diagnosis not present

## 2022-02-06 DIAGNOSIS — R739 Hyperglycemia, unspecified: Secondary | ICD-10-CM | POA: Diagnosis not present

## 2022-02-06 DIAGNOSIS — K580 Irritable bowel syndrome with diarrhea: Secondary | ICD-10-CM | POA: Diagnosis not present

## 2022-02-06 DIAGNOSIS — K219 Gastro-esophageal reflux disease without esophagitis: Secondary | ICD-10-CM | POA: Diagnosis not present

## 2022-02-06 DIAGNOSIS — E785 Hyperlipidemia, unspecified: Secondary | ICD-10-CM | POA: Diagnosis not present

## 2022-02-06 DIAGNOSIS — Z9181 History of falling: Secondary | ICD-10-CM | POA: Diagnosis not present

## 2022-02-06 DIAGNOSIS — Z79899 Other long term (current) drug therapy: Secondary | ICD-10-CM | POA: Diagnosis not present

## 2022-02-25 DIAGNOSIS — E041 Nontoxic single thyroid nodule: Secondary | ICD-10-CM | POA: Diagnosis not present

## 2022-02-25 DIAGNOSIS — E785 Hyperlipidemia, unspecified: Secondary | ICD-10-CM | POA: Diagnosis not present

## 2022-02-25 DIAGNOSIS — E0590 Thyrotoxicosis, unspecified without thyrotoxic crisis or storm: Secondary | ICD-10-CM | POA: Diagnosis not present

## 2022-02-25 DIAGNOSIS — M8580 Other specified disorders of bone density and structure, unspecified site: Secondary | ICD-10-CM | POA: Diagnosis not present

## 2022-02-25 DIAGNOSIS — R739 Hyperglycemia, unspecified: Secondary | ICD-10-CM | POA: Diagnosis not present

## 2022-02-25 DIAGNOSIS — E559 Vitamin D deficiency, unspecified: Secondary | ICD-10-CM | POA: Diagnosis not present

## 2022-03-21 DIAGNOSIS — K219 Gastro-esophageal reflux disease without esophagitis: Secondary | ICD-10-CM | POA: Diagnosis not present

## 2022-03-21 DIAGNOSIS — K317 Polyp of stomach and duodenum: Secondary | ICD-10-CM | POA: Diagnosis not present

## 2022-03-21 DIAGNOSIS — K644 Residual hemorrhoidal skin tags: Secondary | ICD-10-CM | POA: Diagnosis not present

## 2022-03-21 DIAGNOSIS — Z86010 Personal history of colonic polyps: Secondary | ICD-10-CM | POA: Diagnosis not present

## 2022-03-21 DIAGNOSIS — K5730 Diverticulosis of large intestine without perforation or abscess without bleeding: Secondary | ICD-10-CM | POA: Diagnosis not present

## 2022-03-21 DIAGNOSIS — K449 Diaphragmatic hernia without obstruction or gangrene: Secondary | ICD-10-CM | POA: Diagnosis not present

## 2022-03-21 DIAGNOSIS — K635 Polyp of colon: Secondary | ICD-10-CM | POA: Diagnosis not present

## 2022-03-21 DIAGNOSIS — D120 Benign neoplasm of cecum: Secondary | ICD-10-CM | POA: Diagnosis not present

## 2022-03-21 DIAGNOSIS — D126 Benign neoplasm of colon, unspecified: Secondary | ICD-10-CM | POA: Diagnosis not present

## 2022-05-27 DIAGNOSIS — E0590 Thyrotoxicosis, unspecified without thyrotoxic crisis or storm: Secondary | ICD-10-CM | POA: Diagnosis not present

## 2022-05-27 DIAGNOSIS — Z79899 Other long term (current) drug therapy: Secondary | ICD-10-CM | POA: Diagnosis not present

## 2022-05-27 DIAGNOSIS — E785 Hyperlipidemia, unspecified: Secondary | ICD-10-CM | POA: Diagnosis not present

## 2022-05-27 DIAGNOSIS — E041 Nontoxic single thyroid nodule: Secondary | ICD-10-CM | POA: Diagnosis not present

## 2022-05-27 DIAGNOSIS — R739 Hyperglycemia, unspecified: Secondary | ICD-10-CM | POA: Diagnosis not present

## 2022-05-27 DIAGNOSIS — E559 Vitamin D deficiency, unspecified: Secondary | ICD-10-CM | POA: Diagnosis not present

## 2022-06-05 DIAGNOSIS — M79604 Pain in right leg: Secondary | ICD-10-CM | POA: Diagnosis not present

## 2022-08-08 DIAGNOSIS — R739 Hyperglycemia, unspecified: Secondary | ICD-10-CM | POA: Diagnosis not present

## 2022-08-08 DIAGNOSIS — K219 Gastro-esophageal reflux disease without esophagitis: Secondary | ICD-10-CM | POA: Diagnosis not present

## 2022-08-08 DIAGNOSIS — E0590 Thyrotoxicosis, unspecified without thyrotoxic crisis or storm: Secondary | ICD-10-CM | POA: Diagnosis not present

## 2022-08-08 DIAGNOSIS — E785 Hyperlipidemia, unspecified: Secondary | ICD-10-CM | POA: Diagnosis not present

## 2022-08-08 DIAGNOSIS — M8589 Other specified disorders of bone density and structure, multiple sites: Secondary | ICD-10-CM | POA: Diagnosis not present

## 2022-08-08 DIAGNOSIS — K580 Irritable bowel syndrome with diarrhea: Secondary | ICD-10-CM | POA: Diagnosis not present

## 2022-08-08 DIAGNOSIS — E559 Vitamin D deficiency, unspecified: Secondary | ICD-10-CM | POA: Diagnosis not present

## 2022-08-08 DIAGNOSIS — Z139 Encounter for screening, unspecified: Secondary | ICD-10-CM | POA: Diagnosis not present

## 2022-08-27 DIAGNOSIS — L814 Other melanin hyperpigmentation: Secondary | ICD-10-CM | POA: Diagnosis not present

## 2022-08-27 DIAGNOSIS — D224 Melanocytic nevi of scalp and neck: Secondary | ICD-10-CM | POA: Diagnosis not present

## 2022-08-27 DIAGNOSIS — L821 Other seborrheic keratosis: Secondary | ICD-10-CM | POA: Diagnosis not present

## 2022-08-27 DIAGNOSIS — L570 Actinic keratosis: Secondary | ICD-10-CM | POA: Diagnosis not present

## 2022-09-12 DIAGNOSIS — E042 Nontoxic multinodular goiter: Secondary | ICD-10-CM | POA: Diagnosis not present

## 2022-09-12 DIAGNOSIS — E041 Nontoxic single thyroid nodule: Secondary | ICD-10-CM | POA: Diagnosis not present

## 2022-10-01 DIAGNOSIS — Z79899 Other long term (current) drug therapy: Secondary | ICD-10-CM | POA: Diagnosis not present

## 2022-10-01 DIAGNOSIS — E559 Vitamin D deficiency, unspecified: Secondary | ICD-10-CM | POA: Diagnosis not present

## 2022-10-01 DIAGNOSIS — E0590 Thyrotoxicosis, unspecified without thyrotoxic crisis or storm: Secondary | ICD-10-CM | POA: Diagnosis not present

## 2022-10-01 DIAGNOSIS — E785 Hyperlipidemia, unspecified: Secondary | ICD-10-CM | POA: Diagnosis not present

## 2022-10-01 DIAGNOSIS — E041 Nontoxic single thyroid nodule: Secondary | ICD-10-CM | POA: Diagnosis not present

## 2022-10-17 DIAGNOSIS — M85851 Other specified disorders of bone density and structure, right thigh: Secondary | ICD-10-CM | POA: Diagnosis not present

## 2022-10-17 DIAGNOSIS — Z1231 Encounter for screening mammogram for malignant neoplasm of breast: Secondary | ICD-10-CM | POA: Diagnosis not present

## 2022-10-17 DIAGNOSIS — M8589 Other specified disorders of bone density and structure, multiple sites: Secondary | ICD-10-CM | POA: Diagnosis not present

## 2022-11-04 DIAGNOSIS — N390 Urinary tract infection, site not specified: Secondary | ICD-10-CM | POA: Diagnosis not present

## 2022-11-04 DIAGNOSIS — R102 Pelvic and perineal pain: Secondary | ICD-10-CM | POA: Diagnosis not present

## 2023-01-01 DIAGNOSIS — L570 Actinic keratosis: Secondary | ICD-10-CM | POA: Diagnosis not present

## 2023-02-09 DIAGNOSIS — E0590 Thyrotoxicosis, unspecified without thyrotoxic crisis or storm: Secondary | ICD-10-CM | POA: Diagnosis not present

## 2023-02-09 DIAGNOSIS — E041 Nontoxic single thyroid nodule: Secondary | ICD-10-CM | POA: Diagnosis not present

## 2023-02-09 DIAGNOSIS — Z79899 Other long term (current) drug therapy: Secondary | ICD-10-CM | POA: Diagnosis not present

## 2023-02-09 DIAGNOSIS — E559 Vitamin D deficiency, unspecified: Secondary | ICD-10-CM | POA: Diagnosis not present

## 2023-02-09 DIAGNOSIS — R739 Hyperglycemia, unspecified: Secondary | ICD-10-CM | POA: Diagnosis not present

## 2023-02-09 DIAGNOSIS — E785 Hyperlipidemia, unspecified: Secondary | ICD-10-CM | POA: Diagnosis not present

## 2023-02-10 DIAGNOSIS — J4520 Mild intermittent asthma, uncomplicated: Secondary | ICD-10-CM | POA: Diagnosis not present

## 2023-02-10 DIAGNOSIS — B009 Herpesviral infection, unspecified: Secondary | ICD-10-CM | POA: Diagnosis not present

## 2023-02-10 DIAGNOSIS — J019 Acute sinusitis, unspecified: Secondary | ICD-10-CM | POA: Diagnosis not present

## 2023-03-05 DIAGNOSIS — E559 Vitamin D deficiency, unspecified: Secondary | ICD-10-CM | POA: Diagnosis not present

## 2023-03-05 DIAGNOSIS — Z9181 History of falling: Secondary | ICD-10-CM | POA: Diagnosis not present

## 2023-03-05 DIAGNOSIS — Z1331 Encounter for screening for depression: Secondary | ICD-10-CM | POA: Diagnosis not present

## 2023-03-05 DIAGNOSIS — E0590 Thyrotoxicosis, unspecified without thyrotoxic crisis or storm: Secondary | ICD-10-CM | POA: Diagnosis not present

## 2023-03-05 DIAGNOSIS — R739 Hyperglycemia, unspecified: Secondary | ICD-10-CM | POA: Diagnosis not present

## 2023-03-05 DIAGNOSIS — E785 Hyperlipidemia, unspecified: Secondary | ICD-10-CM | POA: Diagnosis not present

## 2023-03-05 DIAGNOSIS — K580 Irritable bowel syndrome with diarrhea: Secondary | ICD-10-CM | POA: Diagnosis not present

## 2023-03-05 DIAGNOSIS — R7303 Prediabetes: Secondary | ICD-10-CM | POA: Diagnosis not present

## 2023-03-06 DIAGNOSIS — L570 Actinic keratosis: Secondary | ICD-10-CM | POA: Diagnosis not present

## 2023-03-06 DIAGNOSIS — L814 Other melanin hyperpigmentation: Secondary | ICD-10-CM | POA: Diagnosis not present

## 2023-03-06 DIAGNOSIS — L299 Pruritus, unspecified: Secondary | ICD-10-CM | POA: Diagnosis not present

## 2023-03-06 DIAGNOSIS — L578 Other skin changes due to chronic exposure to nonionizing radiation: Secondary | ICD-10-CM | POA: Diagnosis not present

## 2023-06-23 DIAGNOSIS — E559 Vitamin D deficiency, unspecified: Secondary | ICD-10-CM | POA: Diagnosis not present

## 2023-06-23 DIAGNOSIS — M8580 Other specified disorders of bone density and structure, unspecified site: Secondary | ICD-10-CM | POA: Diagnosis not present

## 2023-06-23 DIAGNOSIS — R739 Hyperglycemia, unspecified: Secondary | ICD-10-CM | POA: Diagnosis not present

## 2023-06-23 DIAGNOSIS — E0590 Thyrotoxicosis, unspecified without thyrotoxic crisis or storm: Secondary | ICD-10-CM | POA: Diagnosis not present

## 2023-06-23 DIAGNOSIS — E041 Nontoxic single thyroid nodule: Secondary | ICD-10-CM | POA: Diagnosis not present

## 2023-06-23 DIAGNOSIS — E785 Hyperlipidemia, unspecified: Secondary | ICD-10-CM | POA: Diagnosis not present

## 2023-07-01 DIAGNOSIS — H52223 Regular astigmatism, bilateral: Secondary | ICD-10-CM | POA: Diagnosis not present

## 2023-07-01 DIAGNOSIS — H43813 Vitreous degeneration, bilateral: Secondary | ICD-10-CM | POA: Diagnosis not present

## 2023-07-01 DIAGNOSIS — H4303 Vitreous prolapse, bilateral: Secondary | ICD-10-CM | POA: Diagnosis not present

## 2023-07-01 DIAGNOSIS — H5203 Hypermetropia, bilateral: Secondary | ICD-10-CM | POA: Diagnosis not present

## 2023-07-01 DIAGNOSIS — H43393 Other vitreous opacities, bilateral: Secondary | ICD-10-CM | POA: Diagnosis not present

## 2023-07-01 DIAGNOSIS — H524 Presbyopia: Secondary | ICD-10-CM | POA: Diagnosis not present

## 2023-07-01 DIAGNOSIS — H25813 Combined forms of age-related cataract, bilateral: Secondary | ICD-10-CM | POA: Diagnosis not present

## 2023-07-27 DIAGNOSIS — E0590 Thyrotoxicosis, unspecified without thyrotoxic crisis or storm: Secondary | ICD-10-CM | POA: Diagnosis not present

## 2023-07-27 DIAGNOSIS — E559 Vitamin D deficiency, unspecified: Secondary | ICD-10-CM | POA: Diagnosis not present

## 2023-07-27 DIAGNOSIS — E785 Hyperlipidemia, unspecified: Secondary | ICD-10-CM | POA: Diagnosis not present

## 2023-07-27 DIAGNOSIS — R61 Generalized hyperhidrosis: Secondary | ICD-10-CM | POA: Diagnosis not present

## 2023-07-27 DIAGNOSIS — R7303 Prediabetes: Secondary | ICD-10-CM | POA: Diagnosis not present

## 2023-09-08 DIAGNOSIS — Z6830 Body mass index (BMI) 30.0-30.9, adult: Secondary | ICD-10-CM | POA: Diagnosis not present

## 2023-09-08 DIAGNOSIS — B009 Herpesviral infection, unspecified: Secondary | ICD-10-CM | POA: Diagnosis not present

## 2023-09-08 DIAGNOSIS — K219 Gastro-esophageal reflux disease without esophagitis: Secondary | ICD-10-CM | POA: Diagnosis not present

## 2023-09-08 DIAGNOSIS — E0590 Thyrotoxicosis, unspecified without thyrotoxic crisis or storm: Secondary | ICD-10-CM | POA: Diagnosis not present

## 2023-09-08 DIAGNOSIS — E785 Hyperlipidemia, unspecified: Secondary | ICD-10-CM | POA: Diagnosis not present

## 2023-09-08 DIAGNOSIS — K580 Irritable bowel syndrome with diarrhea: Secondary | ICD-10-CM | POA: Diagnosis not present

## 2023-09-08 DIAGNOSIS — J4520 Mild intermittent asthma, uncomplicated: Secondary | ICD-10-CM | POA: Diagnosis not present

## 2023-09-08 DIAGNOSIS — Z23 Encounter for immunization: Secondary | ICD-10-CM | POA: Diagnosis not present

## 2023-09-08 DIAGNOSIS — E559 Vitamin D deficiency, unspecified: Secondary | ICD-10-CM | POA: Diagnosis not present

## 2023-09-08 DIAGNOSIS — R7303 Prediabetes: Secondary | ICD-10-CM | POA: Diagnosis not present

## 2023-09-08 DIAGNOSIS — M791 Myalgia, unspecified site: Secondary | ICD-10-CM | POA: Diagnosis not present

## 2023-10-28 DIAGNOSIS — E785 Hyperlipidemia, unspecified: Secondary | ICD-10-CM | POA: Diagnosis not present

## 2023-10-28 DIAGNOSIS — Z79899 Other long term (current) drug therapy: Secondary | ICD-10-CM | POA: Diagnosis not present

## 2023-10-28 DIAGNOSIS — E041 Nontoxic single thyroid nodule: Secondary | ICD-10-CM | POA: Diagnosis not present

## 2023-10-28 DIAGNOSIS — E0590 Thyrotoxicosis, unspecified without thyrotoxic crisis or storm: Secondary | ICD-10-CM | POA: Diagnosis not present

## 2023-10-28 DIAGNOSIS — M8580 Other specified disorders of bone density and structure, unspecified site: Secondary | ICD-10-CM | POA: Diagnosis not present

## 2023-10-28 DIAGNOSIS — E559 Vitamin D deficiency, unspecified: Secondary | ICD-10-CM | POA: Diagnosis not present

## 2023-11-04 DIAGNOSIS — Z1231 Encounter for screening mammogram for malignant neoplasm of breast: Secondary | ICD-10-CM | POA: Diagnosis not present

## 2023-11-27 DIAGNOSIS — L570 Actinic keratosis: Secondary | ICD-10-CM | POA: Diagnosis not present

## 2023-11-27 DIAGNOSIS — L814 Other melanin hyperpigmentation: Secondary | ICD-10-CM | POA: Diagnosis not present

## 2023-11-27 DIAGNOSIS — D224 Melanocytic nevi of scalp and neck: Secondary | ICD-10-CM | POA: Diagnosis not present

## 2023-11-27 DIAGNOSIS — L219 Seborrheic dermatitis, unspecified: Secondary | ICD-10-CM | POA: Diagnosis not present

## 2023-11-27 DIAGNOSIS — D225 Melanocytic nevi of trunk: Secondary | ICD-10-CM | POA: Diagnosis not present

## 2024-01-14 DIAGNOSIS — M791 Myalgia, unspecified site: Secondary | ICD-10-CM | POA: Diagnosis not present

## 2024-01-14 DIAGNOSIS — E559 Vitamin D deficiency, unspecified: Secondary | ICD-10-CM | POA: Diagnosis not present

## 2024-01-14 DIAGNOSIS — K219 Gastro-esophageal reflux disease without esophagitis: Secondary | ICD-10-CM | POA: Diagnosis not present

## 2024-01-14 DIAGNOSIS — J4520 Mild intermittent asthma, uncomplicated: Secondary | ICD-10-CM | POA: Diagnosis not present

## 2024-01-14 DIAGNOSIS — B009 Herpesviral infection, unspecified: Secondary | ICD-10-CM | POA: Diagnosis not present

## 2024-01-14 DIAGNOSIS — R030 Elevated blood-pressure reading, without diagnosis of hypertension: Secondary | ICD-10-CM | POA: Diagnosis not present

## 2024-01-14 DIAGNOSIS — E785 Hyperlipidemia, unspecified: Secondary | ICD-10-CM | POA: Diagnosis not present

## 2024-01-14 DIAGNOSIS — E0590 Thyrotoxicosis, unspecified without thyrotoxic crisis or storm: Secondary | ICD-10-CM | POA: Diagnosis not present

## 2024-01-14 DIAGNOSIS — Z6831 Body mass index (BMI) 31.0-31.9, adult: Secondary | ICD-10-CM | POA: Diagnosis not present

## 2024-01-14 DIAGNOSIS — K580 Irritable bowel syndrome with diarrhea: Secondary | ICD-10-CM | POA: Diagnosis not present

## 2024-01-14 DIAGNOSIS — R7303 Prediabetes: Secondary | ICD-10-CM | POA: Diagnosis not present

## 2024-02-26 DIAGNOSIS — E785 Hyperlipidemia, unspecified: Secondary | ICD-10-CM | POA: Diagnosis not present

## 2024-02-26 DIAGNOSIS — E559 Vitamin D deficiency, unspecified: Secondary | ICD-10-CM | POA: Diagnosis not present

## 2024-02-26 DIAGNOSIS — R7303 Prediabetes: Secondary | ICD-10-CM | POA: Diagnosis not present

## 2024-02-26 DIAGNOSIS — E0590 Thyrotoxicosis, unspecified without thyrotoxic crisis or storm: Secondary | ICD-10-CM | POA: Diagnosis not present

## 2024-02-26 DIAGNOSIS — M8580 Other specified disorders of bone density and structure, unspecified site: Secondary | ICD-10-CM | POA: Diagnosis not present

## 2024-02-26 DIAGNOSIS — Z79899 Other long term (current) drug therapy: Secondary | ICD-10-CM | POA: Diagnosis not present

## 2024-02-26 DIAGNOSIS — E041 Nontoxic single thyroid nodule: Secondary | ICD-10-CM | POA: Diagnosis not present

## 2024-06-17 DIAGNOSIS — Z6830 Body mass index (BMI) 30.0-30.9, adult: Secondary | ICD-10-CM | POA: Diagnosis not present

## 2024-06-17 DIAGNOSIS — E785 Hyperlipidemia, unspecified: Secondary | ICD-10-CM | POA: Diagnosis not present

## 2024-06-17 DIAGNOSIS — J4520 Mild intermittent asthma, uncomplicated: Secondary | ICD-10-CM | POA: Diagnosis not present

## 2024-06-17 DIAGNOSIS — B009 Herpesviral infection, unspecified: Secondary | ICD-10-CM | POA: Diagnosis not present

## 2024-06-17 DIAGNOSIS — E0590 Thyrotoxicosis, unspecified without thyrotoxic crisis or storm: Secondary | ICD-10-CM | POA: Diagnosis not present

## 2024-06-17 DIAGNOSIS — R7303 Prediabetes: Secondary | ICD-10-CM | POA: Diagnosis not present

## 2024-06-17 DIAGNOSIS — M791 Myalgia, unspecified site: Secondary | ICD-10-CM | POA: Diagnosis not present

## 2024-06-17 DIAGNOSIS — K219 Gastro-esophageal reflux disease without esophagitis: Secondary | ICD-10-CM | POA: Diagnosis not present

## 2024-06-17 DIAGNOSIS — E559 Vitamin D deficiency, unspecified: Secondary | ICD-10-CM | POA: Diagnosis not present

## 2024-06-30 DIAGNOSIS — E0590 Thyrotoxicosis, unspecified without thyrotoxic crisis or storm: Secondary | ICD-10-CM | POA: Diagnosis not present

## 2024-06-30 DIAGNOSIS — R7303 Prediabetes: Secondary | ICD-10-CM | POA: Diagnosis not present

## 2024-06-30 DIAGNOSIS — E041 Nontoxic single thyroid nodule: Secondary | ICD-10-CM | POA: Diagnosis not present

## 2024-06-30 DIAGNOSIS — Z79899 Other long term (current) drug therapy: Secondary | ICD-10-CM | POA: Diagnosis not present

## 2024-06-30 DIAGNOSIS — M8580 Other specified disorders of bone density and structure, unspecified site: Secondary | ICD-10-CM | POA: Diagnosis not present
